# Patient Record
Sex: Female | Born: 1975
Health system: Southern US, Community
[De-identification: ages and names within clinical notes are randomized; demographics above are authoritative.]

## PROBLEM LIST (undated history)

## (undated) DIAGNOSIS — G4700 Insomnia, unspecified: Secondary | ICD-10-CM

## (undated) DIAGNOSIS — L309 Dermatitis, unspecified: Secondary | ICD-10-CM

## (undated) DIAGNOSIS — F32A Depression, unspecified: Secondary | ICD-10-CM

## (undated) DIAGNOSIS — M4802 Spinal stenosis, cervical region: Secondary | ICD-10-CM

## (undated) DIAGNOSIS — E785 Hyperlipidemia, unspecified: Secondary | ICD-10-CM

## (undated) DIAGNOSIS — J302 Other seasonal allergic rhinitis: Secondary | ICD-10-CM

## (undated) DIAGNOSIS — I10 Essential (primary) hypertension: Secondary | ICD-10-CM

## (undated) DIAGNOSIS — N3281 Overactive bladder: Secondary | ICD-10-CM

## (undated) DIAGNOSIS — R05 Cough: Secondary | ICD-10-CM

## (undated) DIAGNOSIS — M8580 Other specified disorders of bone density and structure, unspecified site: Secondary | ICD-10-CM

## (undated) DIAGNOSIS — E611 Iron deficiency: Secondary | ICD-10-CM

## (undated) DIAGNOSIS — J310 Chronic rhinitis: Secondary | ICD-10-CM

## (undated) DIAGNOSIS — R058 Other specified cough: Secondary | ICD-10-CM

## (undated) DIAGNOSIS — R42 Dizziness and giddiness: Secondary | ICD-10-CM

## (undated) DIAGNOSIS — E039 Hypothyroidism, unspecified: Secondary | ICD-10-CM

## (undated) DIAGNOSIS — F419 Anxiety disorder, unspecified: Secondary | ICD-10-CM

## (undated) DIAGNOSIS — M797 Fibromyalgia: Secondary | ICD-10-CM

## (undated) DIAGNOSIS — F329 Major depressive disorder, single episode, unspecified: Secondary | ICD-10-CM

## (undated) DIAGNOSIS — L80 Vitiligo: Secondary | ICD-10-CM

## (undated) HISTORY — DX: Essential (primary) hypertension: I10

## (undated) HISTORY — PX: SINOSCOPY: SHX187

## (undated) HISTORY — DX: Fibromyalgia: M79.7

## (undated) HISTORY — DX: Iron deficiency: E61.1

## (undated) HISTORY — DX: Dizziness and giddiness: R42

## (undated) HISTORY — DX: Dermatitis, unspecified: L30.9

## (undated) HISTORY — DX: Hypothyroidism, unspecified: E03.9

## (undated) HISTORY — DX: Hyperlipidemia, unspecified: E78.5

## (undated) HISTORY — PX: WISDOM TOOTH EXTRACTION: SHX21

## (undated) HISTORY — DX: Chronic rhinitis: J31.0

## (undated) HISTORY — DX: Overactive bladder: N32.81

## (undated) HISTORY — DX: Major depressive disorder, single episode, unspecified: F32.9

## (undated) HISTORY — DX: Depression, unspecified: F32.A

## (undated) HISTORY — DX: Spinal stenosis, cervical region: M48.02

## (undated) HISTORY — DX: Cough: R05

## (undated) HISTORY — DX: Other specified cough: R05.8

## (undated) HISTORY — DX: Vitiligo: L80

## (undated) HISTORY — PX: OTHER SURGICAL HISTORY: SHX169

## (undated) HISTORY — DX: Other specified disorders of bone density and structure, unspecified site: M85.80

---

## 1996-07-08 HISTORY — PX: DILATION AND CURETTAGE OF UTERUS: SHX78

## 2011-06-08 HISTORY — PX: NASAL SEPTUM SURGERY: SHX37

## 2012-04-07 HISTORY — PX: POLYPECTOMY: SHX149

## 2012-12-08 HISTORY — PX: HYSTEROSCOPY: SHX211

## 2015-05-16 ENCOUNTER — Encounter: Payer: Self-pay | Admitting: *Deleted

## 2015-05-17 ENCOUNTER — Ambulatory Visit (INDEPENDENT_AMBULATORY_CARE_PROVIDER_SITE_OTHER): Payer: Federal, State, Local not specified - PPO | Admitting: Internal Medicine

## 2015-05-17 ENCOUNTER — Encounter (INDEPENDENT_AMBULATORY_CARE_PROVIDER_SITE_OTHER): Payer: Self-pay

## 2015-05-17 ENCOUNTER — Encounter: Payer: Self-pay | Admitting: Internal Medicine

## 2015-05-17 VITALS — BP 110/80 | HR 94 | Ht 70.0 in | Wt 234.0 lb

## 2015-05-17 DIAGNOSIS — I10 Essential (primary) hypertension: Secondary | ICD-10-CM

## 2015-05-17 DIAGNOSIS — R05 Cough: Secondary | ICD-10-CM | POA: Diagnosis not present

## 2015-05-17 DIAGNOSIS — R058 Other specified cough: Secondary | ICD-10-CM

## 2015-05-17 MED ORDER — VALSARTAN-HYDROCHLOROTHIAZIDE 160-12.5 MG PO TABS: 1.0000 | ORAL_TABLET | Freq: Every day | ORAL | Status: DC

## 2015-05-17 NOTE — Progress Notes (Signed)
   Subjective:    Patient ID: Melissa Chandler, female    DOB: 1976/07/12,    MRN: [id number]  HPI   73 yowf never smoker no trouble as child new onset as late teenager onset HA/ sneezing /itching [place] by [place] allergy to dogs but did not complete the test rx clariton ? Help persistent daily problems with flares of "sinus infection" eval by ENT in [place] rec sinus surgery x one 2012 but continued to have draingage issues and moved to [place] Jan 2015 and Gus Height referred to pulmonary.     05/17/2015 1st  Pulmonary office visit/ Melissa Chandler  /  Lisinopril daily x 10 y  Chief Complaint  Patient presents with  . Pulmonary Consult    Referred by Melissa Plan, PA. Pt c/o "allergies"and PND for the past 20 yrs. She had sinus surgery in 2012 and symptoms seem worse since then.   cough better p kenalog shot 2 weeks prior to OV  Then best when lying down and early in am worst p stir around in am sensation of excess pnds / throat clearing and hoarseness but no excess mucus production/ really present daily x years and assoc with sensation of dysphagia to point of choking   No obvious  patterns in day to day or daytime variabilty or assoc chronic cough or cp or chest tightness, subjective wheeze overt sinus or hb symptoms. No unusual exp hx or h/o childhood pna/ asthma or knowledge of premature birth.  Sleeping ok without nocturnal  or early am exacerbation  of respiratory  c/o's or need for noct saba. Also denies any obvious fluctuation of symptoms with weather or environmental changes or other aggravating or alleviating factors except as outlined above   Current Medications, Allergies, Complete Past Medical History, Past Surgical History, Family History, and Social History were reviewed in [employer] record.           Review of Systems  Constitutional: Negative for fever, chills and unexpected weight change.  HENT: Positive for postnasal drip, sinus pressure and trouble  swallowing. Negative for congestion, dental problem, ear pain, nosebleeds, rhinorrhea, sneezing, sore throat and voice change.   Eyes: Negative for visual disturbance.  Respiratory: Negative for cough, choking and shortness of breath.   Cardiovascular: Negative for chest pain and leg swelling.  Gastrointestinal: Negative for vomiting, abdominal pain and diarrhea.  Genitourinary: Negative for difficulty urinating.  Musculoskeletal: Negative for arthralgias.  Skin: Negative for rash.  Neurological: Positive for headaches. Negative for tremors and syncope.  Hematological: Does not bruise/bleed easily.       Objective:   Physical Exam  amb wf with voice fatigue  Wt Readings from Last 3 Encounters:  05/17/15 234 lb (106.142 kg)    Vital signs reviewed  HEENT: nl dentition, turbinates, and orophanx. Nl external ear canals without cough reflex   NECK :  without JVD/Nodes/TM/ nl carotid upstrokes bilaterally   LUNGS: no acc muscle use, clear to A and P bilaterally without cough on insp or exp maneuvers   CV:  RRR  no s3 or murmur or increase in P2, no edema   ABD:  soft and nontender with nl excursion in the supine position. No bruits or organomegaly, bowel sounds nl  MS:  warm without deformities, calf tenderness, cyanosis or clubbing  SKIN: warm and dry without lesions    NEURO:  alert, approp, no deficits         Assessment & Chandler:

## 2015-05-17 NOTE — Patient Instructions (Addendum)
For drainage instead of zyrtec try to  take chlortrimeton (chlorpheniramine) 4 mg every 4 hours available over the counter (may cause drowsiness)   Stop lisinopril   Try Diovan 160 12.5  Every day instead of lisinopril   See Tammy NP in 4  weeks with all your medications, even over the counter meds, separated in two separate bags, the ones you take no matter what vs the ones you stop once you feel better and take only as needed when you feel you need them.   Tammy  will generate for you a new user friendly medication calendar that will put [place] all on the same page re: your medication use.     Without this process, it simply isn't possible to assure that we are providing  your outpatient care  with  the attention to detail we feel you deserve.   If we cannot assure that you're getting that kind of care,  then we cannot manage your problem effectively from this clinic.  Once you have seen Tammy and we are sure that we're all on the same page with your medication use she will arrange follow up with me.

## 2015-05-18 ENCOUNTER — Encounter: Payer: Self-pay | Admitting: Internal Medicine

## 2015-05-18 DIAGNOSIS — R058 Other specified cough: Secondary | ICD-10-CM | POA: Insufficient documentation

## 2015-05-18 DIAGNOSIS — R05 Cough: Secondary | ICD-10-CM | POA: Insufficient documentation

## 2015-05-18 DIAGNOSIS — I10 Essential (primary) hypertension: Secondary | ICD-10-CM | POA: Insufficient documentation

## 2015-05-18 NOTE — Assessment & Plan Note (Signed)
ACE inhibitors are problematic in  pts with airway complaints because  even experienced pulmonologists can't always distinguish ace effects from copd/asthma/pnds/ allergies etc.  By themselves they don't actually cause a problem, much like oxygen can't by itself start a fire, but they certainly serve as a powerful catalyst or enhancer for any "fire"  or inflammatory process in the upper airway, be it caused by an ET  tube or more commonly reflux (especially in the obese or pts with known GERD or who are on biphoshonates) or URI's, due to interference with bradykinin clearance.  The effects of acei on bradykinin levels occurs in 100% of pt's on acei (unless they surreptitiously stop the med!) but the classic cough is only reported in 5%.  This leaves 95% of pts on acei's  with a variety of syndromes including no identifiable symptom in most  vs non-specific symptoms that wax and wane depending on what other insult is occuring at the level of the upper airway (which may be seasonal  or perennial rhinitis in her case or GERD in that order)  rec trial of diovan 160/12.5 mg daily for now

## 2015-05-18 NOTE — Assessment & Plan Note (Addendum)
The most common causes of chronic cough in immunocompetent adults include the following: upper airway cough syndrome (UACS), previously referred to as postnasal drip syndrome (PNDS), which is caused by variety of rhinosinus conditions; (2) asthma; (3) GERD; (4) chronic bronchitis from cigarette smoking or other inhaled environmental irritants; (5) nonasthmatic eosinophilic bronchitis; and (6) bronchiectasis.   These conditions, singly or in combination, have accounted for up to 94% of the causes of chronic cough in prospective studies.   Other conditions have constituted no >6% of the causes in prospective studies These have included bronchogenic carcinoma, chronic interstitial pneumonia, sarcoidosis, left ventricular failure, ACEI-induced cough, and aspiration from a condition associated with pharyngeal dysfunction.    Chronic cough is often simultaneously caused by more than one condition. A single cause has been found from 38 to 82% of the time, multiple causes from 18 to 62%. Multiply caused cough has been the result of three diseases up to 42% of the time.       Based on hx and exam, this is most likely:  Classic Upper airway cough syndrome, so named because it's frequently impossible to sort out how much is  CR/sinusitis with freq throat clearing (which can be related to primary GERD)   vs  causing  secondary (" extra esophageal")  GERD from wide swings in gastric pressure that occur with throat clearing, often  promoting self use of mint and menthol lozenges that reduce the lower esophageal sphincter tone and exacerbate the problem further in a cyclical fashion.   These are the same pts (now being labeled as having "irritable larynx syndrome" by some cough centers) who not infrequently have a history of having failed to tolerate ace inhibitors,  dry powder inhalers or biphosphonates or report having atypical reflux symptoms that don't respond to standard doses of PPI , and are easily confused as  having aecopd or asthma flares by even experienced allergists/ pulmonologists.   The first step is to  eliminate acei  then regroup in 2 weeks/ in meantime try 1st gen H1 per guidelines for sense of pnds (may well have underlying rhinitis as trigger and acei as "fuel" for upper airways inflammation)   See instructions for specific recommendations which were reviewed directly with the patient who was given a copy with highlighter outlining the key components.

## 2015-06-19 ENCOUNTER — Encounter: Payer: Federal, State, Local not specified - PPO | Admitting: Adult Health

## 2016-04-17 ENCOUNTER — Other Ambulatory Visit: Payer: Self-pay | Admitting: Internal Medicine

## 2016-05-20 ENCOUNTER — Other Ambulatory Visit: Payer: Self-pay | Admitting: Internal Medicine

## 2017-03-10 DIAGNOSIS — E7800 Pure hypercholesterolemia, unspecified: Secondary | ICD-10-CM | POA: Diagnosis not present

## 2017-03-10 DIAGNOSIS — I10 Essential (primary) hypertension: Secondary | ICD-10-CM | POA: Diagnosis not present

## 2017-03-31 DIAGNOSIS — Z79899 Other long term (current) drug therapy: Secondary | ICD-10-CM | POA: Diagnosis not present

## 2017-03-31 DIAGNOSIS — M546 Pain in thoracic spine: Secondary | ICD-10-CM | POA: Diagnosis not present

## 2017-03-31 DIAGNOSIS — G894 Chronic pain syndrome: Secondary | ICD-10-CM | POA: Diagnosis not present

## 2017-03-31 DIAGNOSIS — M25559 Pain in unspecified hip: Secondary | ICD-10-CM | POA: Diagnosis not present

## 2017-03-31 DIAGNOSIS — Z79891 Long term (current) use of opiate analgesic: Secondary | ICD-10-CM | POA: Diagnosis not present

## 2017-04-08 DIAGNOSIS — E7800 Pure hypercholesterolemia, unspecified: Secondary | ICD-10-CM | POA: Diagnosis not present

## 2017-04-08 DIAGNOSIS — E038 Other specified hypothyroidism: Secondary | ICD-10-CM | POA: Diagnosis not present

## 2017-04-08 DIAGNOSIS — D509 Iron deficiency anemia, unspecified: Secondary | ICD-10-CM | POA: Diagnosis not present

## 2017-04-10 DIAGNOSIS — I10 Essential (primary) hypertension: Secondary | ICD-10-CM | POA: Diagnosis not present

## 2017-04-17 ENCOUNTER — Other Ambulatory Visit: Payer: Self-pay | Admitting: Physician Assistant

## 2017-04-17 ENCOUNTER — Ambulatory Visit
Admission: RE | Admit: 2017-04-17 | Discharge: 2017-04-17 | Disposition: A | Discharge status: Home / Self Care | Payer: 59 | Source: Ambulatory Visit | Attending: Physician Assistant | Admitting: Physician Assistant

## 2017-04-17 DIAGNOSIS — M25561 Pain in right knee: Secondary | ICD-10-CM

## 2017-04-17 DIAGNOSIS — M546 Pain in thoracic spine: Secondary | ICD-10-CM | POA: Diagnosis not present

## 2017-04-17 DIAGNOSIS — M25562 Pain in left knee: Secondary | ICD-10-CM

## 2017-04-17 DIAGNOSIS — M542 Cervicalgia: Secondary | ICD-10-CM

## 2017-04-17 DIAGNOSIS — M4302 Spondylolysis, cervical region: Secondary | ICD-10-CM | POA: Diagnosis not present

## 2017-04-17 DIAGNOSIS — M1711 Unilateral primary osteoarthritis, right knee: Secondary | ICD-10-CM | POA: Diagnosis not present

## 2017-05-12 DIAGNOSIS — M546 Pain in thoracic spine: Secondary | ICD-10-CM | POA: Diagnosis not present

## 2017-05-12 DIAGNOSIS — Z79891 Long term (current) use of opiate analgesic: Secondary | ICD-10-CM | POA: Diagnosis not present

## 2017-05-12 DIAGNOSIS — G894 Chronic pain syndrome: Secondary | ICD-10-CM | POA: Diagnosis not present

## 2017-05-12 DIAGNOSIS — Z79899 Other long term (current) drug therapy: Secondary | ICD-10-CM | POA: Diagnosis not present

## 2017-05-12 DIAGNOSIS — M25559 Pain in unspecified hip: Secondary | ICD-10-CM | POA: Diagnosis not present

## 2017-07-01 DIAGNOSIS — Z789 Other specified health status: Secondary | ICD-10-CM | POA: Diagnosis not present

## 2017-07-01 DIAGNOSIS — Z23 Encounter for immunization: Secondary | ICD-10-CM | POA: Diagnosis not present

## 2017-07-08 DIAGNOSIS — I10 Essential (primary) hypertension: Secondary | ICD-10-CM | POA: Diagnosis not present

## 2017-07-24 DIAGNOSIS — M25559 Pain in unspecified hip: Secondary | ICD-10-CM | POA: Diagnosis not present

## 2017-07-24 DIAGNOSIS — M546 Pain in thoracic spine: Secondary | ICD-10-CM | POA: Diagnosis not present

## 2017-07-24 DIAGNOSIS — G894 Chronic pain syndrome: Secondary | ICD-10-CM | POA: Diagnosis not present

## 2017-07-24 DIAGNOSIS — Z79899 Other long term (current) drug therapy: Secondary | ICD-10-CM | POA: Diagnosis not present

## 2017-07-24 DIAGNOSIS — Z79891 Long term (current) use of opiate analgesic: Secondary | ICD-10-CM | POA: Diagnosis not present

## 2017-09-15 DIAGNOSIS — M25559 Pain in unspecified hip: Secondary | ICD-10-CM | POA: Diagnosis not present

## 2017-09-15 DIAGNOSIS — Z79891 Long term (current) use of opiate analgesic: Secondary | ICD-10-CM | POA: Diagnosis not present

## 2017-09-15 DIAGNOSIS — G894 Chronic pain syndrome: Secondary | ICD-10-CM | POA: Diagnosis not present

## 2017-09-15 DIAGNOSIS — M546 Pain in thoracic spine: Secondary | ICD-10-CM | POA: Diagnosis not present

## 2017-09-15 DIAGNOSIS — Z79899 Other long term (current) drug therapy: Secondary | ICD-10-CM | POA: Diagnosis not present

## 2017-10-27 DIAGNOSIS — E038 Other specified hypothyroidism: Secondary | ICD-10-CM | POA: Diagnosis not present

## 2017-10-27 DIAGNOSIS — E7800 Pure hypercholesterolemia, unspecified: Secondary | ICD-10-CM | POA: Diagnosis not present

## 2017-10-28 DIAGNOSIS — E038 Other specified hypothyroidism: Secondary | ICD-10-CM | POA: Diagnosis not present

## 2017-10-28 DIAGNOSIS — I10 Essential (primary) hypertension: Secondary | ICD-10-CM | POA: Diagnosis not present

## 2017-11-24 DIAGNOSIS — M546 Pain in thoracic spine: Secondary | ICD-10-CM | POA: Diagnosis not present

## 2017-11-24 DIAGNOSIS — Z79891 Long term (current) use of opiate analgesic: Secondary | ICD-10-CM | POA: Diagnosis not present

## 2017-11-24 DIAGNOSIS — M1710 Unilateral primary osteoarthritis, unspecified knee: Secondary | ICD-10-CM | POA: Diagnosis not present

## 2017-11-24 DIAGNOSIS — Z79899 Other long term (current) drug therapy: Secondary | ICD-10-CM | POA: Diagnosis not present

## 2017-11-24 DIAGNOSIS — G894 Chronic pain syndrome: Secondary | ICD-10-CM | POA: Diagnosis not present

## 2018-01-18 DIAGNOSIS — M79606 Pain in leg, unspecified: Secondary | ICD-10-CM | POA: Diagnosis not present

## 2018-02-18 DIAGNOSIS — G894 Chronic pain syndrome: Secondary | ICD-10-CM | POA: Diagnosis not present

## 2018-02-18 DIAGNOSIS — Z79891 Long term (current) use of opiate analgesic: Secondary | ICD-10-CM | POA: Diagnosis not present

## 2018-02-18 DIAGNOSIS — M1710 Unilateral primary osteoarthritis, unspecified knee: Secondary | ICD-10-CM | POA: Diagnosis not present

## 2018-02-18 DIAGNOSIS — Z79899 Other long term (current) drug therapy: Secondary | ICD-10-CM | POA: Diagnosis not present

## 2018-02-18 DIAGNOSIS — M25559 Pain in unspecified hip: Secondary | ICD-10-CM | POA: Diagnosis not present

## 2018-02-18 DIAGNOSIS — M546 Pain in thoracic spine: Secondary | ICD-10-CM | POA: Diagnosis not present

## 2018-04-02 DIAGNOSIS — I10 Essential (primary) hypertension: Secondary | ICD-10-CM | POA: Diagnosis not present

## 2018-04-02 DIAGNOSIS — E7800 Pure hypercholesterolemia, unspecified: Secondary | ICD-10-CM | POA: Diagnosis not present

## 2018-04-05 DIAGNOSIS — M797 Fibromyalgia: Secondary | ICD-10-CM | POA: Diagnosis not present

## 2018-04-05 DIAGNOSIS — E7800 Pure hypercholesterolemia, unspecified: Secondary | ICD-10-CM | POA: Diagnosis not present

## 2018-04-05 DIAGNOSIS — Z0000 Encounter for general adult medical examination without abnormal findings: Secondary | ICD-10-CM | POA: Diagnosis not present

## 2018-04-08 DIAGNOSIS — M1710 Unilateral primary osteoarthritis, unspecified knee: Secondary | ICD-10-CM | POA: Diagnosis not present

## 2018-04-08 DIAGNOSIS — Z79899 Other long term (current) drug therapy: Secondary | ICD-10-CM | POA: Diagnosis not present

## 2018-04-08 DIAGNOSIS — Z79891 Long term (current) use of opiate analgesic: Secondary | ICD-10-CM | POA: Diagnosis not present

## 2018-04-08 DIAGNOSIS — G894 Chronic pain syndrome: Secondary | ICD-10-CM | POA: Diagnosis not present

## 2018-05-20 DIAGNOSIS — M25559 Pain in unspecified hip: Secondary | ICD-10-CM | POA: Diagnosis not present

## 2018-05-20 DIAGNOSIS — G894 Chronic pain syndrome: Secondary | ICD-10-CM | POA: Diagnosis not present

## 2018-05-20 DIAGNOSIS — M1710 Unilateral primary osteoarthritis, unspecified knee: Secondary | ICD-10-CM | POA: Diagnosis not present

## 2018-05-31 DIAGNOSIS — Z01419 Encounter for gynecological examination (general) (routine) without abnormal findings: Secondary | ICD-10-CM | POA: Diagnosis not present

## 2018-06-06 IMAGING — CR DG KNEE COMPLETE 4+V*L*
3 series · 3 of 3 positions shown · non-contrast
Comparison: None.
COMPARISON: None.
COMPARISON: None.

ADDENDUM:
CLINICAL DATA: History of fibromyalgia.  Thoracic pain. No
trauma.

EXAM:
RIGHT KNEE - COMPLETE 4+ VIEW
CLINICAL DATA: History of fibromyalgia. Thoracic pain. No
LEFT KNEE - COMPLETE 4+ VIEW
CLINICAL DATA: History of fibromyalgia per thoracic pain. No
THORACIC SPINE - 3 VIEWS; RIGHT KNEE - COMPLETE 4+ VIEW; LEFT KNEE -
COMPLETE 4+ VIEW

[w knee ap left (1 of 2)]
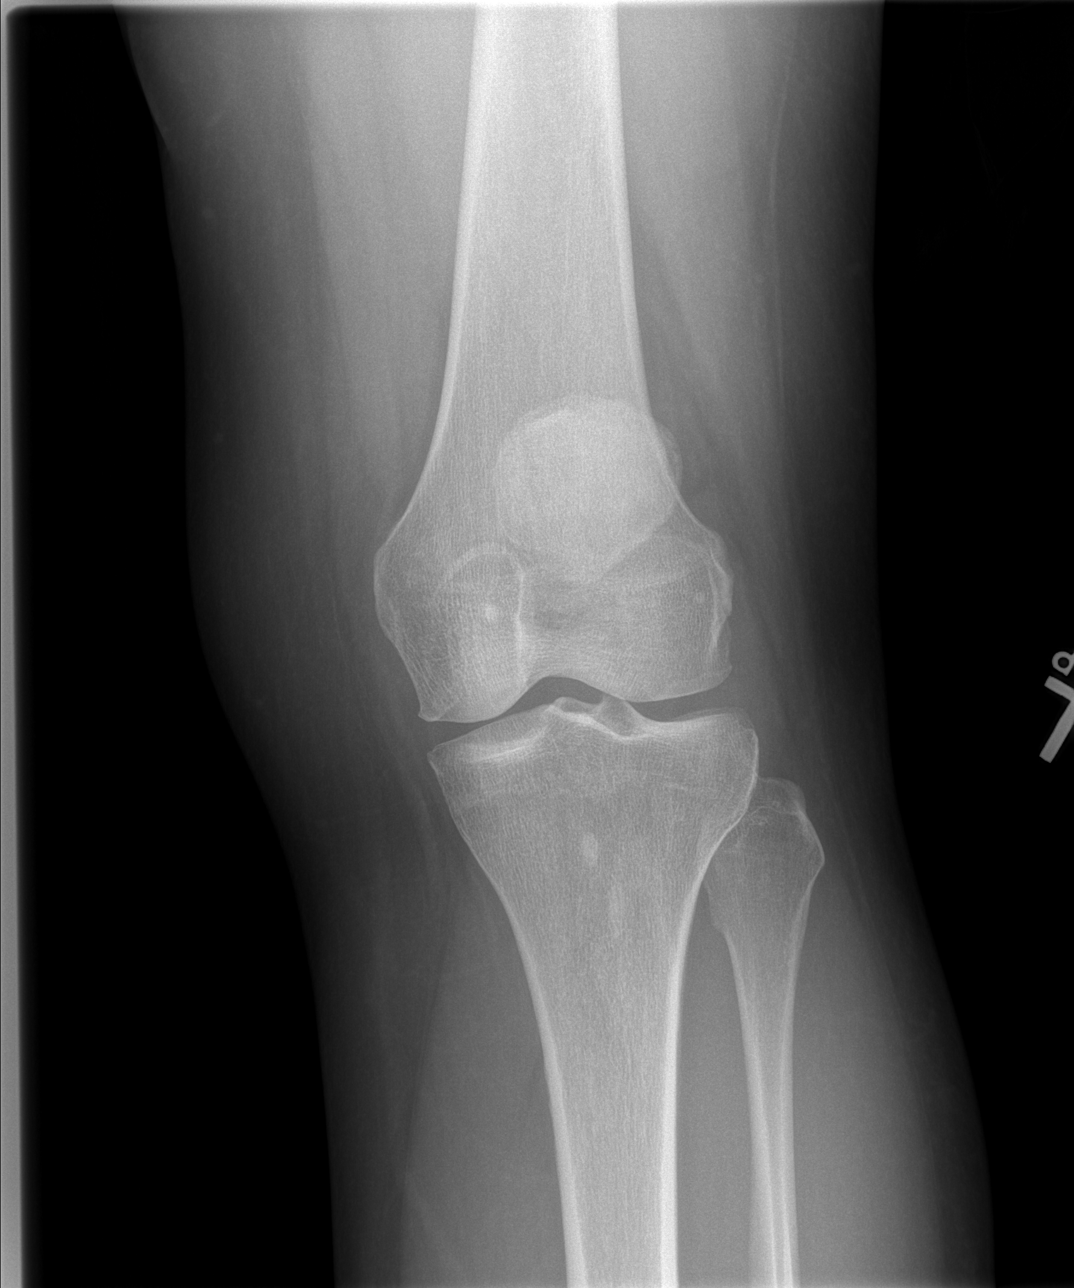

[w knee ap left (2 of 2)]
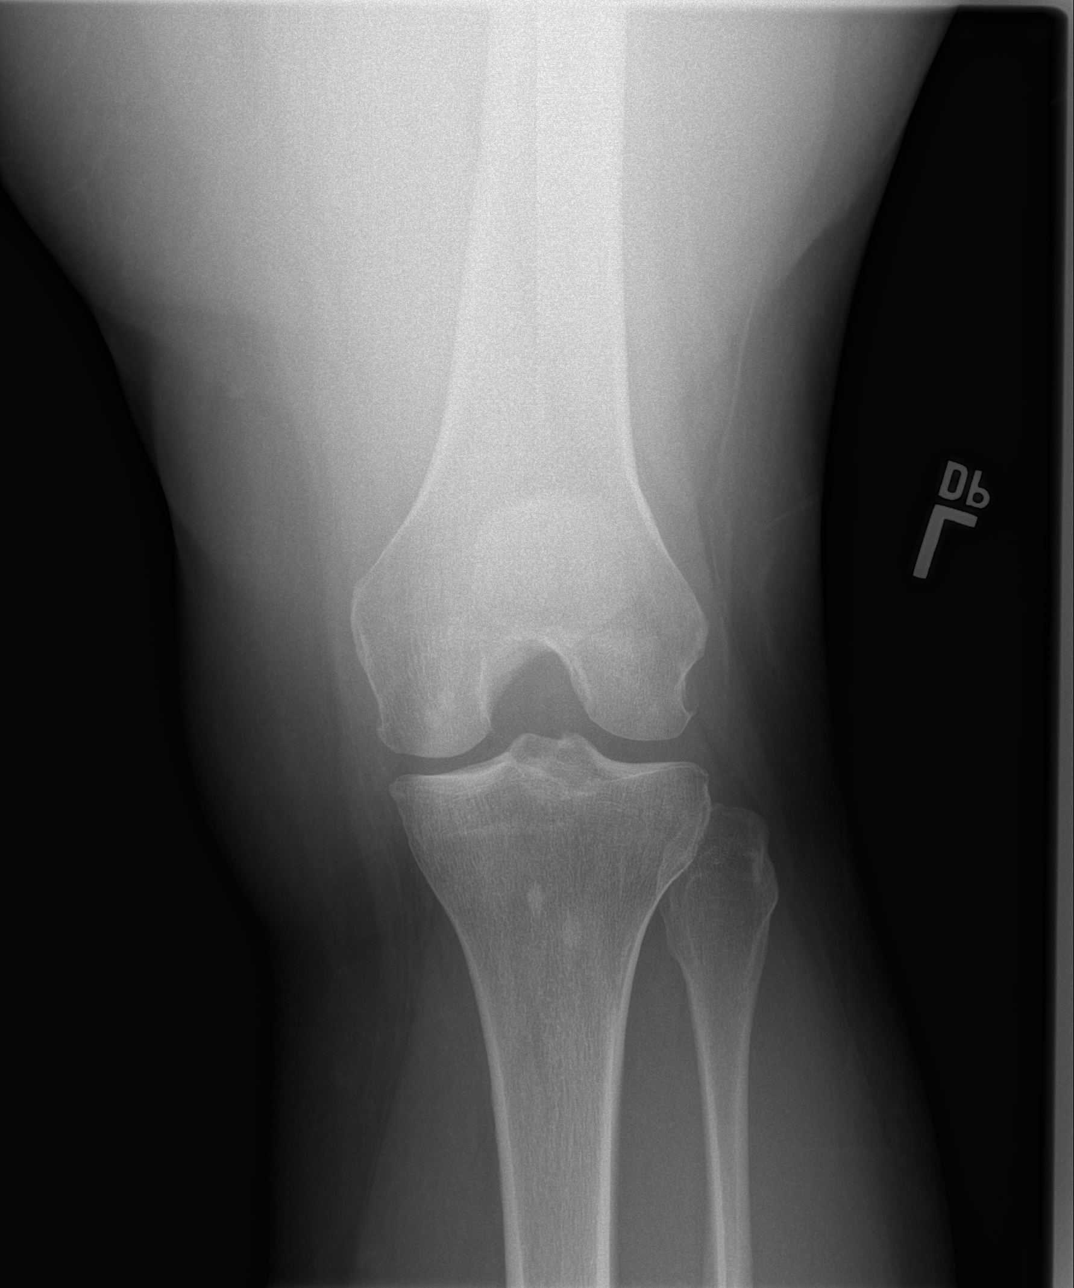

[w knee lat. left]
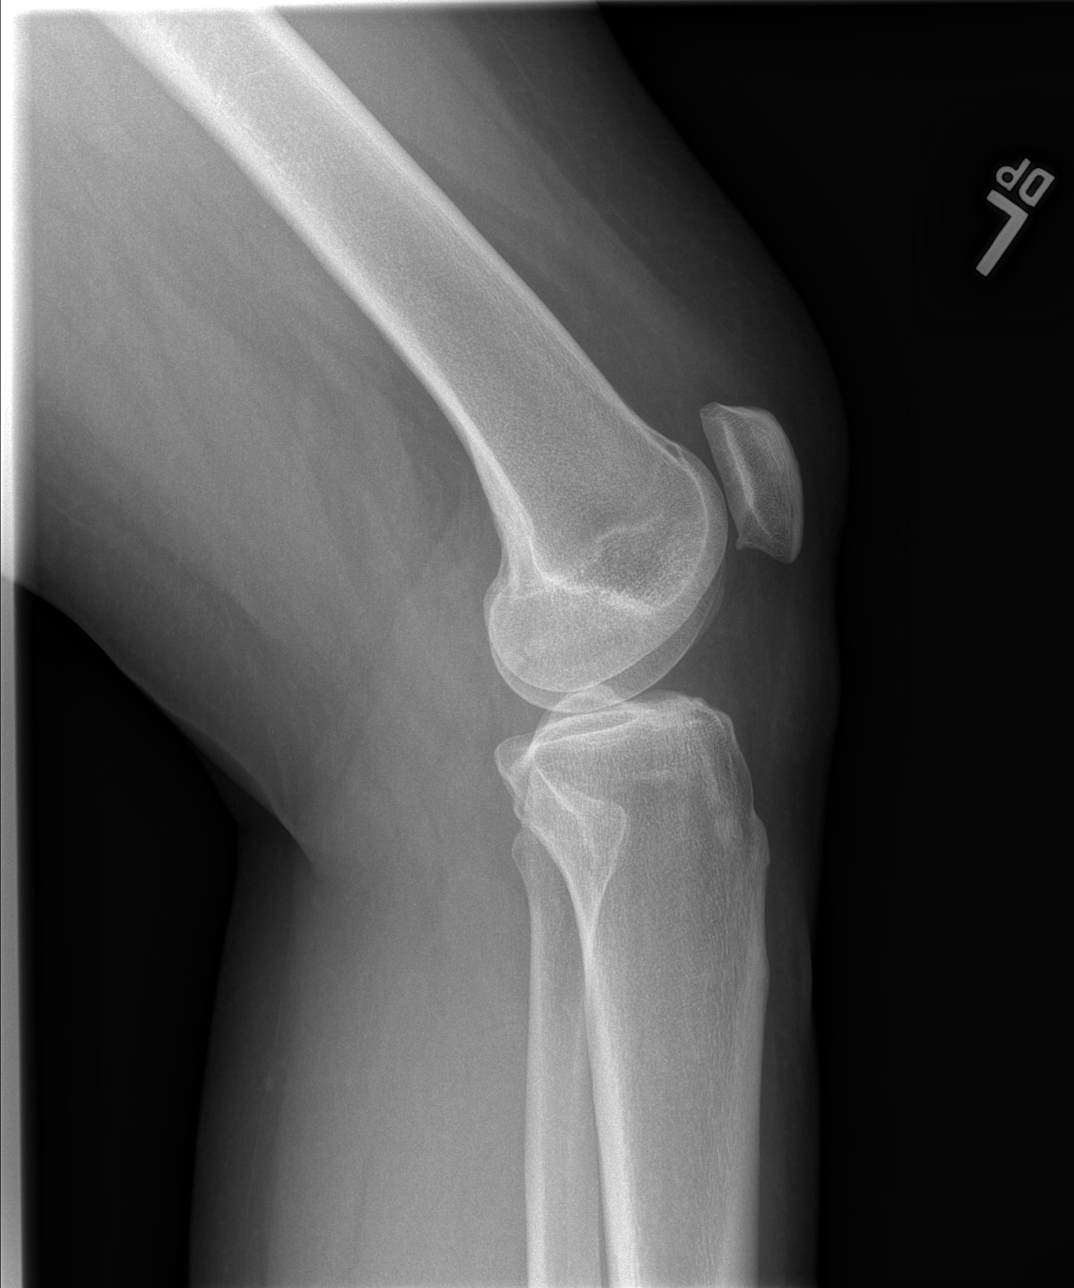

[3 of 3 positions shown; findings below may reference images not displayed]

FINDINGS: There is subtle early degenerative changes. No fracture or
dislocation. No significant joint effusion.
IMPRESSION: No acute findings.

Subtle early degenerative changes.

ADDENDUM:
FINDINGS: There is subtle early degenerative changes. No fracture or
dislocation. No significant joint effusion. There are a few small
bone islands over the distal femur and proximal tibia.
IMPRESSION: No acute findings.

Subtle early degenerative changes.
FINDINGS: Subtle curvature of the upper thoracic spine likely positional.
Vertebral body heights are maintained. There is minimal spondylosis
throughout the thoracic spine. Disc spaces are within normal. No
evidence of compression fracture or subluxation. Pedicles are
intact. Degenerative change of the cervical spine with multilevel
disc disease within the cervical spine.
IMPRESSION: Mild spondylosis of the thoracic spine.  No acute findings.

## 2018-06-06 IMAGING — CR DG CERVICAL SPINE 2 OR 3 VIEWS
3 series · 3 of 3 positions shown · non-contrast
Comparison: None.

CLINICAL DATA: Fibromyalgia and pain.  No trauma

EXAM:
CERVICAL SPINE - 2-3 VIEW

[w c-spine lat]
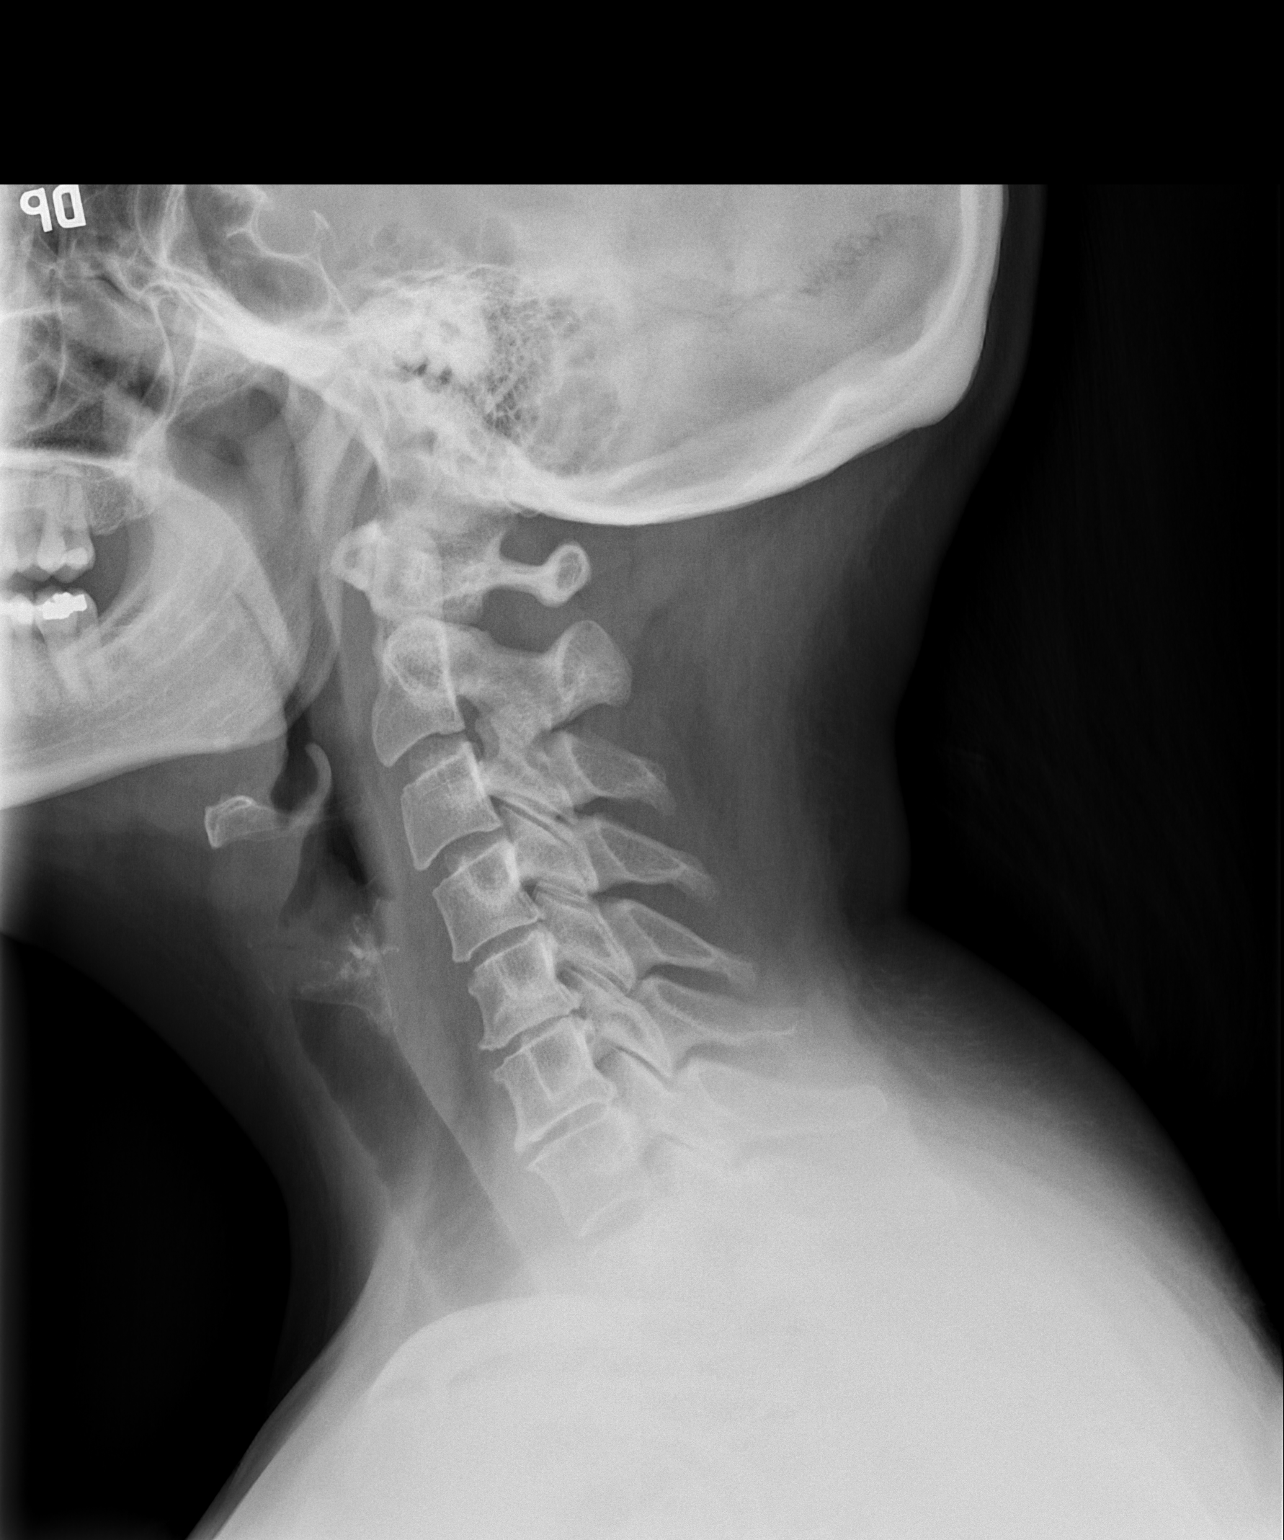

[w c-spine a.p. *]
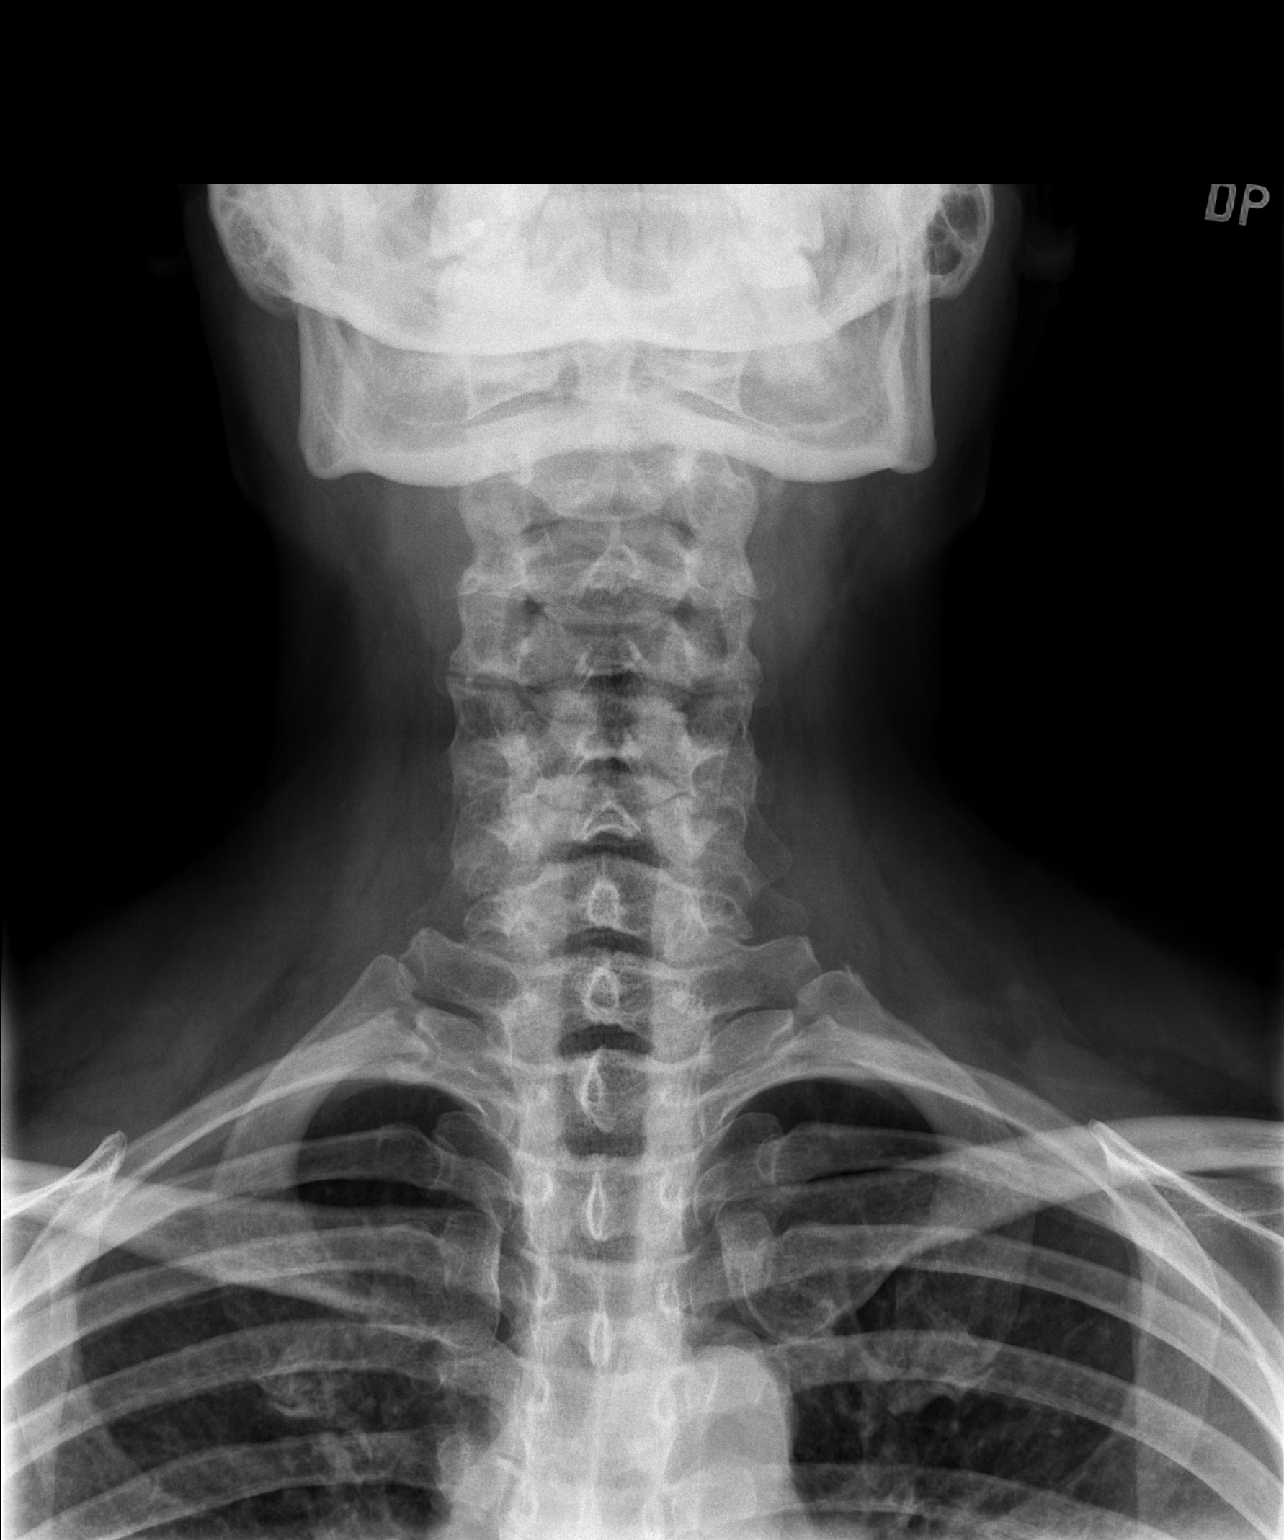

[w c-spine odontoid]
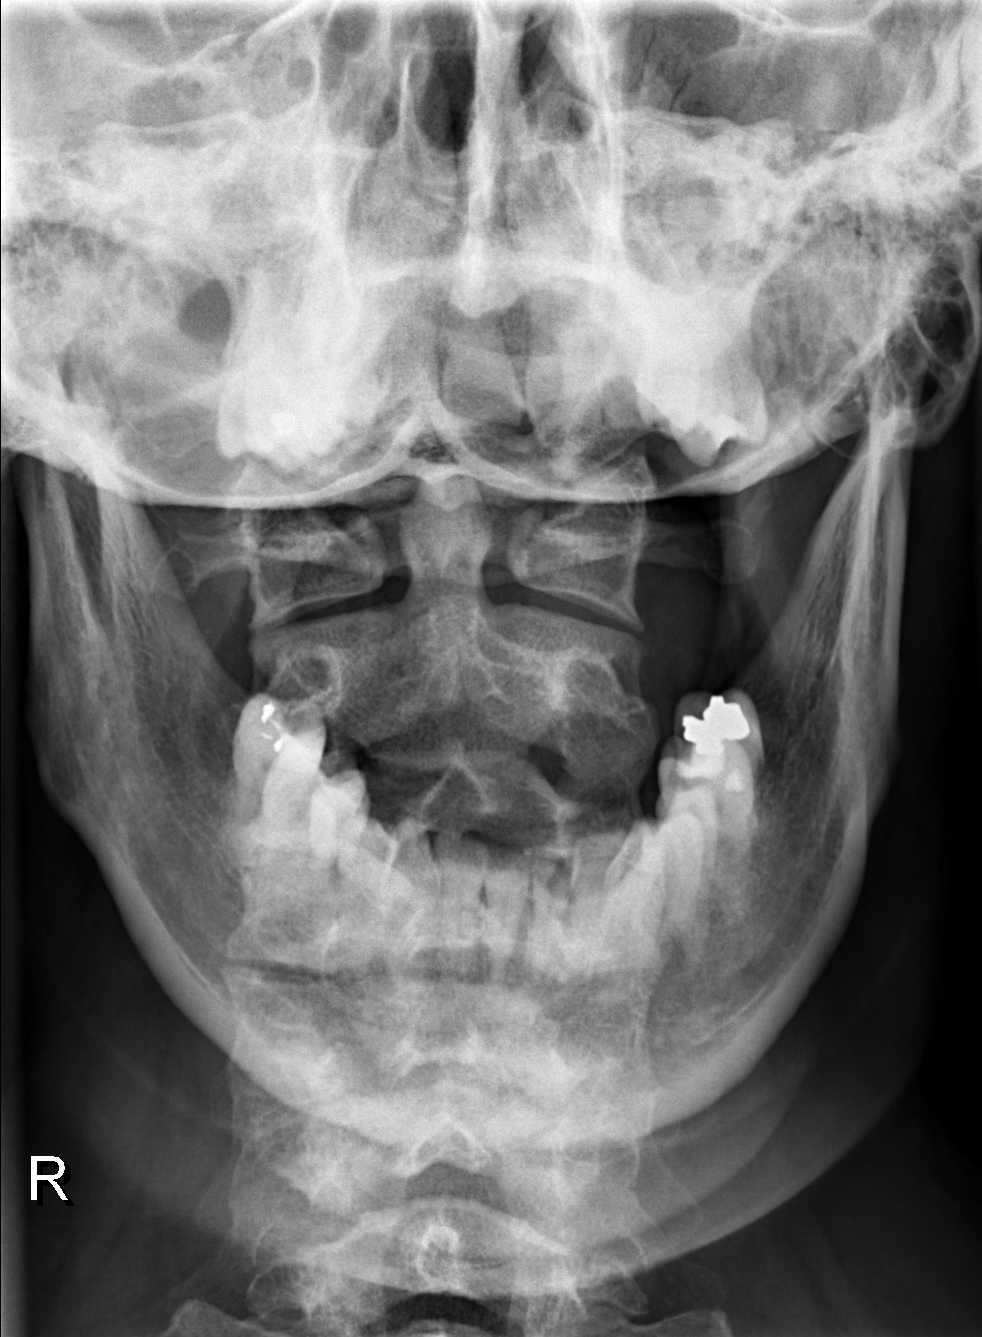

[3 of 3 positions shown; findings below may reference images not displayed]

FINDINGS: There is no evidence of cervical spine fracture or prevertebral soft
tissue swelling. Alignment is normal. There is mild disc space
narrowing at C5-6 and C6-7 with uncovertebral spurring and anterior
osteophyte formation. The lung apices are normal. The dens is
intact. Lateral masses of C1 and C2 are aligned. Prevertebral soft
tissues are normal.
IMPRESSION: Mild lower cervical degenerative disc disease. No fracture or
listhesis.

## 2018-06-22 DIAGNOSIS — Z1231 Encounter for screening mammogram for malignant neoplasm of breast: Secondary | ICD-10-CM | POA: Diagnosis not present

## 2018-07-15 DIAGNOSIS — Z79891 Long term (current) use of opiate analgesic: Secondary | ICD-10-CM | POA: Diagnosis not present

## 2018-07-15 DIAGNOSIS — M1710 Unilateral primary osteoarthritis, unspecified knee: Secondary | ICD-10-CM | POA: Diagnosis not present

## 2018-07-15 DIAGNOSIS — M25559 Pain in unspecified hip: Secondary | ICD-10-CM | POA: Diagnosis not present

## 2018-07-15 DIAGNOSIS — Z79899 Other long term (current) drug therapy: Secondary | ICD-10-CM | POA: Diagnosis not present

## 2018-07-15 DIAGNOSIS — G894 Chronic pain syndrome: Secondary | ICD-10-CM | POA: Diagnosis not present

## 2018-07-20 ENCOUNTER — Other Ambulatory Visit: Payer: Self-pay | Admitting: Obstetrics and Gynecology

## 2018-07-20 DIAGNOSIS — H9313 Tinnitus, bilateral: Secondary | ICD-10-CM | POA: Diagnosis not present

## 2018-07-20 DIAGNOSIS — E038 Other specified hypothyroidism: Secondary | ICD-10-CM | POA: Diagnosis not present

## 2018-07-20 DIAGNOSIS — H938X3 Other specified disorders of ear, bilateral: Secondary | ICD-10-CM | POA: Diagnosis not present

## 2018-07-20 DIAGNOSIS — N939 Abnormal uterine and vaginal bleeding, unspecified: Secondary | ICD-10-CM

## 2018-08-20 ENCOUNTER — Ambulatory Visit
Admission: RE | Admit: 2018-08-20 | Discharge: 2018-08-20 | Disposition: A | Discharge status: Home / Self Care | Payer: 59 | Source: Ambulatory Visit | Attending: Obstetrics and Gynecology | Admitting: Obstetrics and Gynecology

## 2018-08-20 DIAGNOSIS — N939 Abnormal uterine and vaginal bleeding, unspecified: Secondary | ICD-10-CM | POA: Diagnosis not present

## 2018-08-26 DIAGNOSIS — J3089 Other allergic rhinitis: Secondary | ICD-10-CM | POA: Diagnosis not present

## 2018-08-26 DIAGNOSIS — H9011 Conductive hearing loss, unilateral, right ear, with unrestricted hearing on the contralateral side: Secondary | ICD-10-CM | POA: Diagnosis not present

## 2018-08-26 DIAGNOSIS — H9313 Tinnitus, bilateral: Secondary | ICD-10-CM | POA: Diagnosis not present

## 2018-08-26 DIAGNOSIS — J3081 Allergic rhinitis due to animal (cat) (dog) hair and dander: Secondary | ICD-10-CM | POA: Diagnosis not present

## 2018-08-26 DIAGNOSIS — J301 Allergic rhinitis due to pollen: Secondary | ICD-10-CM | POA: Diagnosis not present

## 2018-08-31 DIAGNOSIS — Z23 Encounter for immunization: Secondary | ICD-10-CM | POA: Diagnosis not present

## 2018-09-09 DIAGNOSIS — M47812 Spondylosis without myelopathy or radiculopathy, cervical region: Secondary | ICD-10-CM | POA: Diagnosis not present

## 2018-09-09 DIAGNOSIS — G894 Chronic pain syndrome: Secondary | ICD-10-CM | POA: Diagnosis not present

## 2018-09-09 DIAGNOSIS — M1710 Unilateral primary osteoarthritis, unspecified knee: Secondary | ICD-10-CM | POA: Diagnosis not present

## 2018-09-09 DIAGNOSIS — Z79899 Other long term (current) drug therapy: Secondary | ICD-10-CM | POA: Diagnosis not present

## 2018-09-09 DIAGNOSIS — Z79891 Long term (current) use of opiate analgesic: Secondary | ICD-10-CM | POA: Diagnosis not present

## 2018-09-20 DIAGNOSIS — N920 Excessive and frequent menstruation with regular cycle: Secondary | ICD-10-CM | POA: Diagnosis not present

## 2018-09-23 ENCOUNTER — Ambulatory Visit: Payer: Self-pay | Admitting: Allergy

## 2018-09-27 ENCOUNTER — Ambulatory Visit (INDEPENDENT_AMBULATORY_CARE_PROVIDER_SITE_OTHER): Payer: 59 | Admitting: Allergy

## 2018-09-27 ENCOUNTER — Encounter: Payer: Self-pay | Admitting: Allergy

## 2018-09-27 VITALS — BP 124/72 | HR 93 | Temp 99.1°F | Resp 18 | Ht 67.1 in | Wt 225.6 lb

## 2018-09-27 DIAGNOSIS — R519 Headache, unspecified: Secondary | ICD-10-CM

## 2018-09-27 DIAGNOSIS — R51 Headache: Secondary | ICD-10-CM

## 2018-09-27 DIAGNOSIS — J3089 Other allergic rhinitis: Secondary | ICD-10-CM

## 2018-09-27 MED ORDER — FLUTICASONE PROPIONATE 93 MCG/ACT NA EXHU: 1.0000 | INHALANT_SUSPENSION | Freq: Two times a day (BID) | NASAL | 5 refills | Status: AC

## 2018-09-27 NOTE — Assessment & Plan Note (Addendum)
Perennial rhinitis symptoms for the past 20+ years. Dairy worsens symptoms.  Tried Zyrtec, Allegra, Singulair and Nasonex with minimal benefit.  Allegra D and Afrin seems to work the best. Skin testing in her 52s was positive to dogs and yeast per patient report.  Followed by ENT.  Today's skin testing was positive to: negative to environmental allergy panel including positive control questioning the validity of the results.   Get bloodwork as below and will make additional recommendations based on results.   Start Xhance 1 spray twice a day and demonstrated proper use.   May use over the counter antihistamines such as Zyrtec (cetirizine), Claritin (loratadine), Allegra (fexofenadine), or Xyzal (levocetirizine) daily as needed.  Only use decongestants sparingly when needed and not on a daily basis.   Avoid drinking dairy at night.   Lab Orders     Allergens w/Total IgE Area 2

## 2018-09-27 NOTE — Patient Instructions (Addendum)
Other allergic rhinitis Perennial rhinitis symptoms for the past 20+ years. Dairy worsens symptoms.  Tried Zyrtec, Allegra, Singulair and Nasonex with minimal benefit.  Allegra D and Afrin seems to work the best. Skin testing in her 28s was positive to dogs and yeast per patient report.  Followed by ENT.  Today's skin testing was positive to: negative to environmental allergy panel including positive control questioning the validity of the results.   Get bloodwork as below and will make additional recommendations based on results.   Start Xhance 1 spray twice a day and demonstrated proper use.   May use over the counter antihistamines such as Zyrtec (cetirizine), Claritin (loratadine), Allegra (fexofenadine), or Xyzal (levocetirizine) daily as needed.  Only use decongestants sparingly when needed and not on a daily basis.   Avoid drinking dairy at night.   Lab Orders     Allergens w/Total IgE Area 2  Headache disorder Headaches on and off. Triggers include sound, light and certain scents.  Discussed with patient that she may have migraines given clinical history.  Follow up with PCP regarding this.   Return in about 3 months (around 12/28/2018).

## 2018-09-27 NOTE — Progress Notes (Signed)
New Patient Note  RE: Melissa Chandler MRN: [id number] DOB: 08-Jul-1976 Date of Office Visit: 09/27/2018  Referring provider: Keturah Barre, MD Primary care provider: Jarrett Soho, PA-C  Chief Complaint: Allergy Testing (Enviornmentals)  History of Present Illness: I had the pleasure of seeing Melissa Chandler for initial evaluation at the [facility] on 09/27/2018. She is a 42 y.o. female, who is referred here by ENT Dr. Haroldine Laws for the evaluation of environmental allergies.   She reports symptoms of itchy ears/throat, sneezing, dizziness, nasal congestion, rhinorrhea, sinus pressure. Symptoms have been going on for 20+ years. The symptoms are present all year. Other triggers include exposure to ? dairy. Anosmia: diminished sense of smell. Headache: yes. She has used zyrtec, allegra, Singulair, Nasonex with minimal improvement in symptoms. Afrin seems to help. Sinus infections: none. Previous work up includes: skin testing in her 38s which showed positive to dog and yeast per patient report.  Previous ENT evaluation: yes. Previous sinus imaging: no.  Assessment and Plan: Melissa Chandler is a 42 y.o. female with: Other allergic rhinitis Perennial rhinitis symptoms for the past 20+ years. Dairy worsens symptoms.  Tried Zyrtec, Allegra, Singulair and Nasonex with minimal benefit.  Allegra D and Afrin seems to work the best. Skin testing in her 75s was positive to dogs and yeast per patient report.  Followed by ENT.  Today's skin testing was positive to: negative to environmental allergy panel including positive control questioning the validity of the results.   Get bloodwork as below and will make additional recommendations based on results.   Start Xhance 1 spray twice a day and demonstrated proper use.   May use over the counter antihistamines such as Zyrtec (cetirizine), Claritin (loratadine), Allegra (fexofenadine), or Xyzal (levocetirizine) daily as needed.  Only use  decongestants sparingly when needed and not on a daily basis.   Avoid drinking dairy at night.   Lab Orders     Allergens w/Total IgE Area 2  Headache disorder Headaches on and off. Triggers include sound, light and certain scents.  Discussed with patient that she may have migraines given clinical history.  Follow up with PCP regarding this.   Return in about 3 months (around 12/28/2018).  Meds ordered this encounter  Medications  . Fluticasone Propionate (XHANCE) 93 MCG/ACT EXHU    Sig: Place 1 spray into the nose 2 (two) times daily.    Dispense:  16 mL    Refill:  5   Other allergy screening: Asthma: no Rhino conjunctivitis: yes Food allergy: no Medication allergy: no Hymenoptera allergy: no Urticaria: no Eczema:no History of recurrent infections suggestive of immunodeficency: no  Diagnostics: Skin Testing: Environmental allergy panel. Negative test to: Environmental allergy panel including positive control questioning the validity of results.  Results discussed with patient/family. Airborne Adult Perc - 09/27/18 1459    Time Antigen Placed  1459    Allergen Manufacturer  Waynette Buttery    Location  Back    Number of Test  59    Panel 1  Select    1. Control-Buffer 50% Glycerol  Negative    2. Control-Histamine 1 mg/ml  Negative    3. Albumin saline  Negative    4. Bahia  Negative    5. French Southern Territ[place]  Negative    6. Johnson  Negative    7. Kentucky Blue  Negative    8. Meadow Fescue  Negative    9. Perennial Rye  Negative    10. Sweet Vernal  Negative  11. Timothy  Negative    12. Cocklebur  Negative    13. Burweed Marshelder  Negative    14. Ragweed, short  Negative    15. Ragweed, Giant  Negative    16. Plantain,  English  Negative    17. Lamb's Quarters  Negative    18. Sheep Sorrell  Negative    19. Rough Pigweed  Negative    20. Marsh Elder, Rough  Negative    21. Mugwort, Common  Negative    22. Ash mix  Negative    23. Birch mix  Negative    24. Beech  American  Negative    25. Box, Elder  Negative    26. Cedar, red  Negative    27. Cottonwood, Guinea-Bissau  Negative    28. Elm mix  Negative    29. Hickory mix  Negative    30. Maple mix  Negative    31. Oak, Guinea-Bissau mix  Negative    32. Pecan Pollen  Negative    33. Pine mix  Negative    34. Sycamore Eastern  Negative    35. Walnut, Black Pollen  Negative    36. Alternaria alternata  Negative    37. Cladosporium Herbarum  Negative    38. Aspergillus mix  Negative    39. Penicillium mix  Negative    40. Bipolaris sorokiniana (Helminthosporium)  Negative    41. Drechslera spicifera (Curvularia)  Negative    42. Mucor plumbeus  Negative    43. Fusarium moniliforme  Negative    44. Aureobasidium pullulans (pullulara)  Negative    45. Rhizopus oryzae  Negative    46. Botrytis cinera  Negative    47. Epicoccum nigrum  Negative    48. Phoma betae  Negative    49. Candida Albicans  Negative    50. Trichophyton mentagrophytes  Negative    51. Mite, D Farinae  5,000 AU/ml  Negative    52. Mite, D Pteronyssinus  5,000 AU/ml  Negative    53. Cat Hair 10,000 BAU/ml  Negative    54.  Dog Epithelia  Negative    55. Mixed Feathers  Negative    56. Horse Epithelia  Negative    57. Cockroach, German  Negative    58. Mouse  Negative    59. Tobacco Leaf  Negative       Past Medical History: Patient Active Problem List   Diagnosis Date Noted  . Other allergic rhinitis 09/27/2018  . Headache disorder 09/27/2018  . Upper airway cough syndrome 05/18/2015  . Essential hypertension 05/18/2015   Past Medical History:  Diagnosis Date  . Depression   . Eczema   . Fibromyalgia   . HTN (hypertension)   . Hyperlipidemia   . Hypothyroidism   . Iron deficiency   . Osteopenia   . Overactive bladder    Past Surgical History: Past Surgical History:  Procedure Laterality Date  . CESAREAN SECTION    . DILATION AND CURETTAGE OF UTERUS  07/1996  . NASAL SEPTUM SURGERY  06/2011  . POLYPECTOMY   04/2012   uterine polyps  . SINOSCOPY     Medication List:  Current Outpatient Medications  Medication Sig Dispense Refill  . buPROPion (WELLBUTRIN XL) 150 MG 24 hr tablet Take 150 mg by mouth daily.    . Cholecalciferol (VITAMIN D3 PO) Take 1 tablet by mouth daily.    Marland Kitchen ezetimibe (ZETIA) 10 MG tablet Take 10 mg by mouth daily.  1  . levothyroxine (SYNTHROID, LEVOTHROID) 25 MCG tablet Take 37.5 mcg by mouth daily before breakfast.    . losartan-hydrochlorothiazide (HYZAAR) 100-12.5 MG tablet     . meloxicam (MOBIC) 15 MG tablet Take 15 mg by mouth daily.    Ailene Ards 3-6-9 Fatty Acids (OMEGA-3-6-9 PO) Take 1 tablet by mouth daily.    . pregabalin (LYRICA) 150 MG capsule Take 150 mg by mouth 2 (two) times daily.    Marland Kitchen pyridoxine (B-6) 100 MG tablet Take 100 mg by mouth daily.    . sertraline (ZOLOFT) 50 MG tablet     . tolterodine (DETROL LA) 4 MG 24 hr capsule Take 4 mg by mouth daily.    . traMADol (ULTRAM) 50 MG tablet Take 50 mg by mouth every 6 (six) hours as needed.    . vitamin B-12 (CYANOCOBALAMIN) 250 MCG tablet Take 250 mcg by mouth daily.    . Fluticasone Propionate (XHANCE) 93 MCG/ACT EXHU Place 1 spray into the nose 2 (two) times daily. 16 mL 5   No current facility-administered medications for this visit.    Allergies: No Known Allergies Social History: Social History   Socioeconomic History  . Marital status: Married    Spouse name: Not on file  . Number of children: 2  . Years of education: Not on file  . Highest education level: Not on file  Occupational History  . Occupation: Manufacturing systems engineer  Social Needs  . Financial resource strain: Not on file  . Food insecurity:    Worry: Not on file    Inability: Not on file  . Transportation needs:    Medical: Not on file    Non-medical: Not on file  Tobacco Use  . Smoking status: Never Smoker  . Smokeless tobacco: Never Used  Substance and Sexual Activity  . Alcohol use: No    Alcohol/week: 0.0 standard drinks    . Drug use: No  . Sexual activity: Not on file  Lifestyle  . Physical activity:    Days per week: Not on file    Minutes per session: Not on file  . Stress: Not on file  Relationships  . Social connections:    Talks on phone: Not on file    Gets together: Not on file    Attends religious service: Not on file    Active member of club or organization: Not on file    Attends meetings of clubs or organizations: Not on file    Relationship status: Not on file  Other Topics Concern  . Not on file  Social History Narrative  . Not on file   Lives in a 45+ year old house. Smoking: denies Occupation: Designer, industrial/product HistorySurveyor, minerals in the house: yes Carpet in the family room: no Carpet in the bedroom: no Heating: electric Cooling: central Pet: yes1 dog x 5 yrs, goes into bedroom and sleeps in the same bed.  Family History: Family History  Problem Relation Age of Onset  . Arrhythmia Father        AFIB  . Congestive Heart Failure Father   . Hypertension Father   . Hyperlipidemia Father   . Dementia Father   . Asthma Son   . Leukemia Maternal Grandfather   . Melanoma Maternal Grandmother   . Breast cancer Maternal Aunt   . Allergic rhinitis Neg Hx   . Eczema Neg Hx   . Urticaria Neg Hx    Problem  Relation Asthma                                   son Eczema                                no Food allergy                          No  Allergic rhino conjunctivitis     Son   Review of Systems  Constitutional: Negative for appetite change, chills, fever and unexpected weight change.  HENT: Positive for congestion, postnasal drip, rhinorrhea, sinus pressure and sneezing.   Eyes: Negative for itching.  Respiratory: Negative for cough, chest tightness, shortness of breath and wheezing.   Cardiovascular: Negative for chest pain.  Gastrointestinal: Negative for abdominal pain.  Genitourinary: Negative for difficulty  urinating.  Skin: Negative for rash.  Allergic/Immunologic: Positive for environmental allergies. Negative for food allergies.  Neurological: Positive for headaches.   Objective: BP 124/72 (BP Location: Left Arm, Patient Position: Sitting, Cuff Size: Normal)   Pulse 93   Temp 99.1 F (37.3 C) (Oral)   Resp 18   Ht 5' 7.1" (1.704 m)   Wt 225 lb 9.6 oz (102.3 kg)   SpO2 97%   BMI 35.23 kg/m  Body mass index is 35.23 kg/m. Physical Exam  Constitutional: She is oriented to person, place, and time. She appears well-developed and well-nourished.  HENT:  Head: Normocephalic and atraumatic.  Right Ear: External ear normal.  Left Ear: External ear normal.  Nose: Nose normal.  Mouth/Throat: Oropharynx is clear and moist.  Eyes: Conjunctivae and EOM are normal.  Neck: Neck supple.  Cardiovascular: Normal rate, regular rhythm and normal heart sounds. Exam reveals no gallop and no friction rub.  No murmur heard. Pulmonary/Chest: Effort normal and breath sounds normal. She has no wheezes. She has no rales.  Abdominal: Soft.  Lymphadenopathy:    She has no cervical adenopathy.  Neurological: She is alert and oriented to person, place, and time.  Skin: Skin is warm. No rash noted.  Psychiatric: She has a normal mood and affect. Her behavior is normal.  Nursing note and vitals reviewed.  The plan was reviewed with the patient/family, and all questions/concerned were addressed.  It was my pleasure to see John today and participate in her care. Please feel free to contact me with any questions or concerns.  Sincerely,  Wyline Mood, DO Allergy & Immunology  [facility] office: [phone] [facility] office:[phone]

## 2018-09-27 NOTE — Assessment & Plan Note (Signed)
Headaches on and off. Triggers include sound, light and certain scents.  Discussed with patient that she may have migraines given clinical history.  Follow up with PCP regarding this.

## 2018-09-30 ENCOUNTER — Encounter: Payer: Self-pay | Admitting: Allergy

## 2018-09-30 LAB — ALLERGENS W/TOTAL IGE AREA 2
Alternaria Alternata IgE: <0.1 kU/L
Bermuda Grass IgE: <0.1 kU/L
Cat Dander IgE: <0.1 kU/L
Cladosporium Herbarum IgE: <0.1 kU/L
Cottonwood IgE: <0.1 kU/L
Dog Dander IgE: <0.1 kU/L
Elm, American IgE: <0.1 kU/L
IgE (Immunoglobulin E), Serum: <2 IU/mL — ABNORMAL LOW (ref 6–495)
Johnson Grass IgE: <0.1 kU/L
Mouse Urine IgE: <0.1 kU/L
Pecan, Hickory IgE: <0.1 kU/L
Penicillium Chrysogen IgE: <0.1 kU/L
Pigweed, Rough IgE: <0.1 kU/L
Timothy Grass IgE: <0.1 kU/L
White Mulberry IgE: <0.1 kU/L

## 2018-10-01 ENCOUNTER — Other Ambulatory Visit: Payer: Self-pay | Admitting: Obstetrics and Gynecology

## 2018-10-01 NOTE — Patient Instructions (Addendum)
Your procedure is scheduled on:  Tuesday, 11/5 at 1:30 pm  Enter through the Main Entrance of [facility] at: 12 noon  Pick up the phone at the desk and dial [phone].  Call this number if you have problems the morning of surgery: [phone].  Remember: Do NOT eat food after midnight Monday  Do NOT drink clear liquids (including water) after: 7:30 am Tuesday 11/5  Take these medicines the morning of surgery with a SIP OF WATER: lyrica.  Ok to use nasal spray.  Brush your teeth on the day of surgery.  Stop herbal medications, vitamin supplements, Ibuprofen/NSAIDS 1 week prior to surgery - Wednesday 10/30  Do NOT wear jewelry (body piercing), metal hair clips/bobby pins, make-up, or nail polish. Do NOT wear lotions, powders, or perfumes.  You may wear deoderant. Do NOT shave for 48 hours prior to surgery. Do NOT bring valuables to the hospital.  Have a responsible adult drive you home and stay with you for 24 hours after your procedure.  Home with Mother Melissa Chandler cell [phone]

## 2018-10-04 ENCOUNTER — Encounter (HOSPITAL_COMMUNITY): Payer: Self-pay | Admitting: *Deleted

## 2018-10-04 ENCOUNTER — Encounter (HOSPITAL_COMMUNITY)
Admission: RE | Admit: 2018-10-04 | Discharge: 2018-10-04 | Disposition: A | Discharge status: Home / Self Care | Payer: 59 | Source: Ambulatory Visit | Attending: Obstetrics and Gynecology | Admitting: Obstetrics and Gynecology

## 2018-10-04 ENCOUNTER — Other Ambulatory Visit: Payer: Self-pay

## 2018-10-04 DIAGNOSIS — Z01818 Encounter for other preprocedural examination: Secondary | ICD-10-CM | POA: Diagnosis not present

## 2018-10-04 DIAGNOSIS — R9431 Abnormal electrocardiogram [ECG] [EKG]: Secondary | ICD-10-CM | POA: Insufficient documentation

## 2018-10-04 HISTORY — DX: Other seasonal allergic rhinitis: J30.2

## 2018-10-04 HISTORY — DX: Anxiety disorder, unspecified: F41.9

## 2018-10-04 HISTORY — DX: Insomnia, unspecified: G47.00

## 2018-10-04 LAB — CBC
HCT: 36.7 % (ref 36.0–46.0)
HEMOGLOBIN: 12.5 g/dL (ref 12.0–15.0)
MCH: 28.5 pg (ref 26.0–34.0)
MCHC: 34.1 g/dL (ref 30.0–36.0)
MCV: 83.6 fL (ref 80.0–100.0)
Platelets: 378 10*3/uL (ref 150–400)
RBC: 4.39 MIL/uL (ref 3.87–5.11)
RDW: 12.9 % (ref 11.5–15.5)
WBC: 7.9 10*3/uL (ref 4.0–10.5)
nRBC: 0 % (ref 0.0–0.2)

## 2018-10-04 LAB — BASIC METABOLIC PANEL
Anion gap: 8 (ref 5–15)
BUN: 14 mg/dL (ref 6–20)
CHLORIDE: 101 mmol/L (ref 98–111)
CO2: 27 mmol/L (ref 22–32)
Calcium: 9.5 mg/dL (ref 8.9–10.3)
Creatinine, Ser: 0.81 mg/dL (ref 0.44–1.00)
GFR calc non Af Amer: >60 mL/min (ref >60)
Glucose, Bld: 90 mg/dL (ref 70–99)
POTASSIUM: 3.5 mmol/L (ref 3.5–5.1)
SODIUM: 136 mmol/L (ref 135–145)

## 2018-10-06 ENCOUNTER — Other Ambulatory Visit (HOSPITAL_COMMUNITY): Payer: 59

## 2018-10-06 DIAGNOSIS — N3281 Overactive bladder: Secondary | ICD-10-CM | POA: Diagnosis not present

## 2018-10-06 DIAGNOSIS — I10 Essential (primary) hypertension: Secondary | ICD-10-CM | POA: Diagnosis not present

## 2018-10-06 DIAGNOSIS — E038 Other specified hypothyroidism: Secondary | ICD-10-CM | POA: Diagnosis not present

## 2018-10-06 DIAGNOSIS — E7800 Pure hypercholesterolemia, unspecified: Secondary | ICD-10-CM | POA: Diagnosis not present

## 2018-10-12 ENCOUNTER — Encounter (HOSPITAL_COMMUNITY): Payer: Self-pay

## 2018-10-12 ENCOUNTER — Ambulatory Visit (HOSPITAL_COMMUNITY): Payer: 59 | Admitting: Certified Registered Nurse Anesthetist

## 2018-10-12 ENCOUNTER — Ambulatory Visit (HOSPITAL_COMMUNITY)
Admission: RE | Admit: 2018-10-12 | Discharge: 2018-10-12 | Disposition: A | Discharge status: Home / Self Care | Payer: 59 | Source: Ambulatory Visit | Attending: Obstetrics and Gynecology | Admitting: Obstetrics and Gynecology

## 2018-10-12 ENCOUNTER — Other Ambulatory Visit: Payer: Self-pay

## 2018-10-12 ENCOUNTER — Encounter (HOSPITAL_COMMUNITY)
Admission: RE | Disposition: A | Discharge status: Home / Self Care | Payer: Self-pay | Source: Ambulatory Visit | Attending: Obstetrics and Gynecology

## 2018-10-12 DIAGNOSIS — F329 Major depressive disorder, single episode, unspecified: Secondary | ICD-10-CM | POA: Diagnosis not present

## 2018-10-12 DIAGNOSIS — Z791 Long term (current) use of non-steroidal anti-inflammatories (NSAID): Secondary | ICD-10-CM | POA: Diagnosis not present

## 2018-10-12 DIAGNOSIS — N938 Other specified abnormal uterine and vaginal bleeding: Secondary | ICD-10-CM | POA: Insufficient documentation

## 2018-10-12 DIAGNOSIS — N939 Abnormal uterine and vaginal bleeding, unspecified: Secondary | ICD-10-CM | POA: Diagnosis present

## 2018-10-12 DIAGNOSIS — F419 Anxiety disorder, unspecified: Secondary | ICD-10-CM | POA: Insufficient documentation

## 2018-10-12 DIAGNOSIS — I10 Essential (primary) hypertension: Secondary | ICD-10-CM | POA: Insufficient documentation

## 2018-10-12 DIAGNOSIS — E785 Hyperlipidemia, unspecified: Secondary | ICD-10-CM | POA: Insufficient documentation

## 2018-10-12 DIAGNOSIS — Z79890 Hormone replacement therapy (postmenopausal): Secondary | ICD-10-CM | POA: Diagnosis not present

## 2018-10-12 DIAGNOSIS — M797 Fibromyalgia: Secondary | ICD-10-CM | POA: Insufficient documentation

## 2018-10-12 DIAGNOSIS — E039 Hypothyroidism, unspecified: Secondary | ICD-10-CM | POA: Insufficient documentation

## 2018-10-12 DIAGNOSIS — N840 Polyp of corpus uteri: Secondary | ICD-10-CM | POA: Diagnosis not present

## 2018-10-12 DIAGNOSIS — Z79899 Other long term (current) drug therapy: Secondary | ICD-10-CM | POA: Insufficient documentation

## 2018-10-12 HISTORY — PX: DILATATION & CURETTAGE/HYSTEROSCOPY WITH MYOSURE: SHX6511

## 2018-10-12 HISTORY — PX: HYSTEROSCOPY: SHX211

## 2018-10-12 SURGERY — DILATATION & CURETTAGE/HYSTEROSCOPY WITH MYOSURE
Anesthesia: General | Site: Vagina

## 2018-10-12 MED ORDER — LIDOCAINE HCL (CARDIAC) PF 100 MG/5ML IV SOSY
PREFILLED_SYRINGE | INTRAVENOUS | Status: DC | PRN
  Administered 2018-10-12: 100 mg via INTRAVENOUS

## 2018-10-12 MED ORDER — FENTANYL CITRATE (PF) 100 MCG/2ML IJ SOLN
INTRAMUSCULAR | Status: DC | PRN
  Administered 2018-10-12 (×2): 50 ug via INTRAVENOUS

## 2018-10-12 MED ORDER — DEXAMETHASONE SODIUM PHOSPHATE 10 MG/ML IJ SOLN
INTRAMUSCULAR | Status: DC | PRN
  Administered 2018-10-12: 10 mg via INTRAVENOUS

## 2018-10-12 MED ORDER — FENTANYL CITRATE (PF) 100 MCG/2ML IJ SOLN
INTRAMUSCULAR | Status: AC
  Filled 2018-10-12: qty 2

## 2018-10-12 MED ORDER — FENTANYL CITRATE (PF) 100 MCG/2ML IJ SOLN
25.0000 ug | INTRAMUSCULAR | Status: DC | PRN
  Administered 2018-10-12: 50 ug via INTRAVENOUS

## 2018-10-12 MED ORDER — ONDANSETRON HCL 4 MG/2ML IJ SOLN
INTRAMUSCULAR | Status: AC
  Filled 2018-10-12: qty 2

## 2018-10-12 MED ORDER — DEXAMETHASONE SODIUM PHOSPHATE 10 MG/ML IJ SOLN
INTRAMUSCULAR | Status: AC
  Filled 2018-10-12: qty 1

## 2018-10-12 MED ORDER — LIDOCAINE HCL (CARDIAC) PF 100 MG/5ML IV SOSY
PREFILLED_SYRINGE | INTRAVENOUS | Status: AC
  Filled 2018-10-12: qty 5

## 2018-10-12 MED ORDER — LACTATED RINGERS IV SOLN
INTRAVENOUS | Status: DC
  Administered 2018-10-12 (×2): via INTRAVENOUS

## 2018-10-12 MED ORDER — MIDAZOLAM HCL 2 MG/2ML IJ SOLN
INTRAMUSCULAR | Status: DC | PRN
  Administered 2018-10-12: 2 mg via INTRAVENOUS

## 2018-10-12 MED ORDER — PROPOFOL 10 MG/ML IV BOLUS
INTRAVENOUS | Status: DC | PRN
  Administered 2018-10-12: 150 mg via INTRAVENOUS

## 2018-10-12 MED ORDER — PROPOFOL 10 MG/ML IV BOLUS
INTRAVENOUS | Status: AC
  Filled 2018-10-12: qty 20

## 2018-10-12 MED ORDER — SCOPOLAMINE 1 MG/3DAYS TD PT72
MEDICATED_PATCH | TRANSDERMAL | Status: AC
  Administered 2018-10-12: 1.5 mg via TRANSDERMAL
  Filled 2018-10-12: qty 1

## 2018-10-12 MED ORDER — GLYCOPYRROLATE 0.2 MG/ML IJ SOLN
INTRAMUSCULAR | Status: AC
  Filled 2018-10-12: qty 1

## 2018-10-12 MED ORDER — KETOROLAC TROMETHAMINE 30 MG/ML IJ SOLN
INTRAMUSCULAR | Status: AC
  Filled 2018-10-12: qty 1

## 2018-10-12 MED ORDER — ONDANSETRON HCL 4 MG/2ML IJ SOLN
INTRAMUSCULAR | Status: DC | PRN
  Administered 2018-10-12: 4 mg via INTRAVENOUS

## 2018-10-12 MED ORDER — KETOROLAC TROMETHAMINE 30 MG/ML IJ SOLN
INTRAMUSCULAR | Status: DC | PRN
  Administered 2018-10-12: 30 mg via INTRAMUSCULAR
  Administered 2018-10-12: 30 mg via INTRAVENOUS

## 2018-10-12 MED ORDER — GLYCOPYRROLATE 0.2 MG/ML IJ SOLN
INTRAMUSCULAR | Status: DC | PRN
  Administered 2018-10-12: 0.2 mg via INTRAVENOUS

## 2018-10-12 MED ORDER — SCOPOLAMINE 1 MG/3DAYS TD PT72
1.0000 | MEDICATED_PATCH | Freq: Once | TRANSDERMAL | Status: DC
  Administered 2018-10-12: 1.5 mg via TRANSDERMAL

## 2018-10-12 MED ORDER — MIDAZOLAM HCL 2 MG/2ML IJ SOLN
INTRAMUSCULAR | Status: AC
  Filled 2018-10-12: qty 2

## 2018-10-12 SURGICAL SUPPLY — 15 items
CANISTER SUCT 3000ML PPV (MISCELLANEOUS) ×2 IMPLANT
CATH ROBINSON RED A/P 16FR (CATHETERS) ×2 IMPLANT
DEVICE MYOSURE LITE (MISCELLANEOUS) IMPLANT
DEVICE MYOSURE REACH (MISCELLANEOUS) ×2 IMPLANT
FILTER ARTHROSCOPY CONVERTOR (FILTER) ×2 IMPLANT
GLOVE BIOGEL PI IND STRL 7.0 (GLOVE) ×2 IMPLANT
GLOVE BIOGEL PI INDICATOR 7.0 (GLOVE) ×2
GLOVE ECLIPSE 6.5 STRL STRAW (GLOVE) ×2 IMPLANT
GOWN STRL REUS W/TWL LRG LVL3 (GOWN DISPOSABLE) ×4 IMPLANT
PACK VAGINAL MINOR WOMEN LF (CUSTOM PROCEDURE TRAY) ×2 IMPLANT
PAD OB MATERNITY 4.3X12.25 (PERSONAL CARE ITEMS) ×2 IMPLANT
SEAL ROD LENS SCOPE MYOSURE (ABLATOR) ×2 IMPLANT
TOWEL OR 17X24 6PK STRL BLUE (TOWEL DISPOSABLE) ×4 IMPLANT
TUBING AQUILEX INFLOW (TUBING) ×2 IMPLANT
TUBING AQUILEX OUTFLOW (TUBING) ×2 IMPLANT

## 2018-10-12 NOTE — H&P (Addendum)
Melissa Chandler is an 42 y.o. female G3P2 Female with AUB and endometrial masses noted on sonohysterogram now presents for surgical mgmt of these findings  Pertinent Gynecological History: Menses: flow is moderate Bleeding: dysfunctional uterine bleeding Contraception: none DES exposure: denies Blood transfusions: none Sexually transmitted diseases: no past history Previous GYN Procedures: DNC  Last mammogram: normal Date: 06/2018 Last pap: normal Date: 05/2018 OB History: G3P2   Menstrual History: Menarche age: n/a Patient's last menstrual period was 09/30/2018 (exact date).    Past Medical History:  Diagnosis Date  . Anxiety   . Depression   . Eczema   . Fibromyalgia   . HTN (hypertension)   . Hyperlipidemia   . Hypothyroidism   . Insomnia   . Iron deficiency   . Osteopenia   . Overactive bladder   . Seasonal allergies   . SVD (spontaneous vaginal delivery)    x 1    Past Surgical History:  Procedure Laterality Date  . CESAREAN SECTION  2002   x 1  . DILATION AND CURETTAGE OF UTERUS  07/1996   mab  . NASAL SEPTUM SURGERY  06/2011  . POLYPECTOMY  04/2012   D & C hysteroscopy for uterine polyps  . SINOSCOPY    . WISDOM TOOTH EXTRACTION      Family History  Problem Relation Age of Onset  . Arrhythmia Father        AFIB  . Congestive Heart Failure Father   . Hypertension Father   . Hyperlipidemia Father   . Dementia Father   . Asthma Son   . Leukemia Maternal Grandfather   . Melanoma Maternal Grandmother   . Breast cancer Maternal Aunt   . Allergic rhinitis Neg Hx   . Eczema Neg Hx   . Urticaria Neg Hx     Social History:  reports that she has never smoked. She has never used smokeless tobacco. She reports that she drinks alcohol. She reports that she does not use drugs.  Allergies: No Known Allergies  No medications prior to admission.    Review of Systems  All other systems reviewed and are negative.   Last menstrual period 09/30/2018. Physical  Exam  Constitutional: She appears well-developed and well-nourished.  HENT:  Head: Normocephalic and atraumatic.  Eyes: EOM are normal.  Neck: Neck supple.  Cardiovascular: Regular rhythm.  Respiratory: Effort normal.  GI: Soft.  Genitourinary: Vagina normal.  Genitourinary Comments: Cervix closed And no palp mas Uterus sl enlarged  Musculoskeletal: Normal range of motion.  Neurological: She is alert.  Skin: Skin is warm and dry.  Psychiatric: She has a normal mood and affect.    No results found for this or any previous visit (from the past 24 hour(s)).  No results found.  Assessment/Plan: AUB Endometrial mass P) dx hysteroscopy, D&C hysteroscopic resection of endometrial masses. Risk of surgery reviewed including infection, bleeding, uterine perforation and its risk( 12/998) , fluid overload and its mgmt, thermal injury, poss need for blood transfusion and its risk. All ? answered  Lauralyn Shadowens A Osmond Steckman 10/12/2018, 5:13 AM  Addendum: I have re examined pt. no change since last visit

## 2018-10-12 NOTE — Transfer of Care (Signed)
Immediate Anesthesia Transfer of Care Note  Patient: Melissa Chandler  Procedure(s) Performed: DILATATION & CURETTAGE/HYSTEROSCOPY WITH MYOSURE (N/A Vagina )  Patient Location: PACU  Anesthesia Type:General  Level of Consciousness: awake, alert  and oriented  Airway & Oxygen Therapy: Patient Spontanous Breathing and Patient connected to nasal cannula oxygen  Post-op Assessment: Report given to RN, Post -op Vital signs reviewed and stable and Patient moving all extremities X 4  Post vital signs: Reviewed and stable  Last Vitals:  Vitals Value Taken Time  BP 127/81 10/12/2018  2:07 PM  Temp    Pulse 93 10/12/2018  2:09 PM  Resp 18 10/12/2018  2:09 PM  SpO2 100 % 10/12/2018  2:09 PM  Vitals shown include unvalidated device data.  Last Pain:  Vitals:   10/12/18 1216  TempSrc: Oral  PainSc: 0-No pain      Patients Stated Pain Goal: 3 (10/12/18 1216)  Complications: No apparent anesthesia complications

## 2018-10-12 NOTE — Anesthesia Preprocedure Evaluation (Addendum)
Anesthesia Evaluation  Patient identified by MRN, date of birth, ID band Patient confused    Reviewed: Allergy & Precautions, NPO status , Patient's Chart, lab work & pertinent test results  Airway Mallampati: II  TM Distance: >3 FB Neck ROM: Full    Dental no notable dental hx. (+) Teeth Intact, Dental Advisory Given   Pulmonary neg pulmonary ROS,    Pulmonary exam normal breath sounds clear to auscultation       Cardiovascular hypertension, Pt. on medications negative cardio ROS Normal cardiovascular exam Rhythm:Regular Rate:Normal     Neuro/Psych  Headaches, PSYCHIATRIC DISORDERS Anxiety Depression    GI/Hepatic negative GI ROS, Neg liver ROS,   Endo/Other  Hypothyroidism   Renal/GU negative Renal ROS  negative genitourinary   Musculoskeletal  (+) Fibromyalgia -  Abdominal   Peds  Hematology negative hematology ROS (+)   Anesthesia Other Findings Abnormal bleeding, endometrial mass  Reproductive/Obstetrics                            Anesthesia Physical Anesthesia Plan  ASA: III  Anesthesia Plan: General   Post-op Pain Management:    Induction: Intravenous  PONV Risk Score and Plan: 3 and Dexamethasone, Ondansetron, Scopolamine patch - Pre-op and Midazolam  Airway Management Planned: LMA  Additional Equipment:   Intra-op Plan:   Post-operative Plan: Extubation in OR  Informed Consent: I have reviewed the patients History and Physical, chart, labs and discussed the procedure including the risks, benefits and alternatives for the proposed anesthesia with the patient or authorized representative who has indicated his/her understanding and acceptance.   Dental advisory given  Plan Discussed with: CRNA  Anesthesia Plan Comments:         Anesthesia Quick Evaluation

## 2018-10-12 NOTE — Op Note (Signed)
NAMEBASSHEVA, FLURY MEDICAL RECORD ZO:[id number] ACCOUNT [id number] DATE OF BIRTH:Jul 31, 1976 FACILITY: [facility] LOCATION: [facility] PHYSICIAN:Sahas Sluka A. Jordis Repetto, MD  OPERATIVE REPORT  DATE OF PROCEDURE:  10/12/2018  PREOPERATIVE DIAGNOSIS:  Abnormal uterine bleeding, endometrial mass.  PROCEDURE PERFORMED:  Diagnostic hysteroscopy, hysteroscopic resection of endometrial masses, dilation and curettage.  POSTOPERATIVE DIAGNOSIS:  Abnormal uterine bleeding, endometrial polyps.  ANESTHESIA:  General.  SURGEON:  Maxie Better, MD  ASSISTANT:  None.  DESCRIPTION OF PROCEDURE:  Under adequate general anesthesia, the patient was placed in the dorsal lithotomy position.  She was sterilely prepped and draped in the usual fashion.  The bladder was catheterized for 30 mL of urine.  Examination under anesthesia revealed an anteverted uterus, slightly enlarged.  No adnexal masses could be appreciated.  A bivalve speculum was placed in the vagina.  A single-tooth tenaculum was placed on the anterior lip of the cervix.  The cervix easily accepted a #17  dilator.  The cervix was parous.  The diagnostic hysteroscope was introduced into the uterine cavity.  The left tubal ostia could be seen.  On the right side anteriorly was a large endometrial polyp, and posteriorly there were several also smaller endometrial polyps.  The endocervical canal was without any lesions.  Using the Reach MyoSure resectoscope, all those lesions were removed, as was the endometrial lining circumferentially.  When this was felt to be adequate and complete, the endocervical canal was once again inspected, and all instruments were then removed from the vagina.  SPECIMEN:  Labeled endometrial curettings with polyp were sent to pathology.  ESTIMATED BLOOD LOSS:  Minimal.  COMPLICATIONS:  None.  The patient tolerated the procedure well and was transferred to recovery room in stable condition.  LN/NUANCE  D:10/12/2018  T:10/12/2018 JOB:[id number]

## 2018-10-12 NOTE — Discharge Instructions (Signed)
DISCHARGE INSTRUCTIONS: D&C / Hysteroscopy/Post Anesthesia Home Care Instructions  The following instructions have been prepared to help you care for yourself upon your return home.  CALL  IF TEMP>100.4, NOTHING PER VAGINA X 1 WK, CALL IF SOAKING A MAXI  PAD EVERY HOUR OR MORE FREQUENTLY  You may take Ibuprofen AFTER 8 pm tonight, October 12, 2018.  You may remove Scop patch on or before Friday, October 15, 2018.  Personal hygiene:  Use sanitary pads for vaginal drainage, not tampons.  Shower the day after your procedure.  NO tub baths, pools or Jacuzzis for 2-3 weeks.  Wipe front to back after using the bathroom.  Activity and limitations:  Get plenty of rest for the remainder of the day. A responsible individual must stay with you for 24 hours following the procedure.  For the next 24 hours, DO NOT: -Drive a car -Advertising copywriter -Drink alcoholic beverages -Take any medication unless instructed by your physician -Make any legal decisions or sign important papers.  Do NOT rest in bed all day.  Walking is encouraged.  Walk up and down stairs slowly.  You may resume your normal activity in one to two days or as indicated by your physician.  Sexual activity: NO intercourse for at least 2 weeks after the procedure, or as indicated by your physician.  Return to work: You may resume your work activities in one to two days or as indicated by your doctor.  What to expect after your surgery: Expect to have vaginal bleeding/discharge for 2-3 days and spotting for up to 10 days. It is not unusual to have soreness for up to 1-2 weeks. You may have a slight burning sensation when you urinate for the first day. Mild cramps may continue for a couple of days. You may have a regular period in 2-6 weeks.  Call your doctor for any of the following:  Excessive vaginal bleeding, saturating and changing one pad every hour.  Inability to urinate 6 hours after discharge from hospital.   Pain not relieved by pain medication.  Fever of 100.4 F or greater.  Unusual vaginal discharge or odor.  Meals: Start with liquid foods such as gelatin or soup. Progress to regular foods as tolerated. Avoid greasy, spicy, heavy foods. If nausea and/or vomiting occur, drink only clear liquids until the nausea and/or vomiting subsides. Call your physician if vomiting continues.  Special Instructions/Symptoms: Your throat may feel dry or sore from the anesthesia or the breathing tube placed in your throat during surgery. If this causes discomfort, gargle with warm salt water. The discomfort should disappear within 24 hours.  If you had a scopolamine patch placed behind your ear for the management of post- operative nausea and/or vomiting:  1. The medication in the patch is effective for 72 hours, after which it should be removed.  Wrap patch in a tissue and discard in the trash. Wash hands thoroughly with soap and water. 2. You may remove the patch earlier than 72 hours if you experience unpleasant side effects which may include dry mouth, dizziness or visual disturbances. 3. Avoid touching the patch. Wash your hands with soap and water after contact with the patch.

## 2018-10-12 NOTE — Brief Op Note (Signed)
10/12/2018  2:06 PM  PATIENT:  Melissa Chandler  42 y.o. female  PRE-OPERATIVE DIAGNOSIS:  Abnormal Uterine Bleeding, Endometrial Mass  POST-OPERATIVE DIAGNOSIS:  abnormal uterine bleeding, endometrial polyps  PROCEDURE:  Diagnostic hysteroscopy, D&C, hysteroscopic resection of endometrial polyps  SURGEON:  Surgeon(s) and Role:    * Advay Volante, Nena Jordan, MD - Primary  PHYSICIAN ASSISTANT:   ASSISTANTS: none   ANESTHESIA:   general Findings: multiple endometrial polyps, left tubal ostia seen, right tubal ostia sl sclerosed, nl endocervical canal EBL:  5 mL   BLOOD ADMINISTERED:none  DRAINS: none   LOCAL MEDICATIONS USED:  NONE  SPECIMEN:  Source of Specimen:  EMC with endometrial polyps  DISPOSITION OF SPECIMEN:  PATHOLOGY  COUNTS:  YES  TOURNIQUET:  * No tourniquets in log *  DICTATION: .Other Dictation: Dictation Number [id number]  PLAN OF CARE: Discharge to home after PACU  PATIENT DISPOSITION:  PACU - hemodynamically stable.   Delay start of Pharmacological VTE agent (>24hrs) due to surgical blood loss or risk of bleeding: no

## 2018-10-12 NOTE — Anesthesia Procedure Notes (Signed)
Procedure Name: LMA Insertion Date/Time: 10/12/2018 1:29 PM Performed by: Elmer Picker, MD Pre-anesthesia Checklist: Patient identified, Patient being monitored, Emergency Drugs available, Timeout performed and Suction available Patient Re-evaluated:Patient Re-evaluated prior to induction Oxygen Delivery Method: Circle System Utilized Preoxygenation: Pre-oxygenation with 100% oxygen Induction Type: IV induction Ventilation: Mask ventilation without difficulty LMA: LMA inserted LMA Size: 4.0 Number of attempts: 1 Placement Confirmation: positive ETCO2 and breath sounds checked- equal and bilateral

## 2018-10-13 ENCOUNTER — Encounter: Payer: Self-pay | Admitting: Neurology

## 2018-10-13 ENCOUNTER — Ambulatory Visit (INDEPENDENT_AMBULATORY_CARE_PROVIDER_SITE_OTHER): Payer: 59 | Admitting: Neurology

## 2018-10-13 VITALS — BP 128/82 | HR 81 | Ht 68.0 in | Wt 224.0 lb

## 2018-10-13 DIAGNOSIS — H814 Vertigo of central origin: Secondary | ICD-10-CM

## 2018-10-13 DIAGNOSIS — R51 Headache with orthostatic component, not elsewhere classified: Secondary | ICD-10-CM

## 2018-10-13 DIAGNOSIS — G43809 Other migraine, not intractable, without status migrainosus: Secondary | ICD-10-CM | POA: Insufficient documentation

## 2018-10-13 DIAGNOSIS — G43109 Migraine with aura, not intractable, without status migrainosus: Secondary | ICD-10-CM

## 2018-10-13 DIAGNOSIS — G43009 Migraine without aura, not intractable, without status migrainosus: Secondary | ICD-10-CM | POA: Diagnosis not present

## 2018-10-13 DIAGNOSIS — H53459 Other localized visual field defect, unspecified eye: Secondary | ICD-10-CM

## 2018-10-13 DIAGNOSIS — H539 Unspecified visual disturbance: Secondary | ICD-10-CM

## 2018-10-13 DIAGNOSIS — R519 Headache, unspecified: Secondary | ICD-10-CM

## 2018-10-13 MED ORDER — ONDANSETRON 4 MG PO TBDP: 4.0000 mg | ORAL_TABLET | Freq: Three times a day (TID) | ORAL | 3 refills | Status: AC | PRN

## 2018-10-13 MED ORDER — RIZATRIPTAN BENZOATE 10 MG PO TBDP: 10.0000 mg | ORAL_TABLET | ORAL | 11 refills | Status: DC | PRN

## 2018-10-13 NOTE — Progress Notes (Signed)
[facility]    Provider:  Dr Lucia Gaskins Referring Provider: Jarrett Soho, PA-C Primary Care Physician:  Jarrett Soho, PA-C  CC:  migraines  HPI:  Melissa Chandler is a 42 y.o. female here as requested by Dr. Delena Serve for migraine.  PMHx hypercholesterolemia, osteopenia, depression, hypertension, fibromyalgia, iron deficiency anemia, tinnitus of both ears, chronic constipation, hypothyroidism, restless legs. Symptoms started in her 41s. She has light sensitivity, sound sensitivity, smells can make her nauseous and she has headaches more in the frontal area and behind the eyes and at the base of her skull. She has the symptoms up to 3x a week, lasts all day long, zofran helps. Sleep helps. The headaches can be pounding and throbbing and may be unilateral. Sometimes the symptoms preceded headache other times not.  She just started to have peripheral vision loss, black and fuzzy and then a severe headache and vomiting. Movement makes it worse. She wakes up with headaces and if she bends over the headache is worse. No FHx of migraines. She has episodes of vertigo. Son andmother has migraines.No other focal neurologic deficits, associated symptoms, inciting events or modifiable factors.  meds tried: Lyrica, zoloft, topamax  Reviewed notes, labs and imaging from outside physicians, which showed:  Reviewed notes from referring physician Jarrett Soho.  Patient was seen at the [facility] [place] in also seen by Dr. Haroldine Laws for the evaluation of environmental allergies and sinusitis.  These can trigger her headaches.  Headaches are on and off, triggers include sound light and certain scents.  She said ringing in her ears for years.  She saw Dr. Haroldine Laws for this.  She also has some vertigo and being off balance.  She has allergy issues.  She is had sinus surgery and septal surgery back in [place].  Examination at Dr. Haroldine Laws showed that the ears are  perfectly clear but audiogram shows a mild conductive loss of the right ear in the lower frequency she does complain of tinnitus and mild vertigo.  Dr. Haroldine Laws as well as her PCP and the allergy specialist suggested she may have migraines.  Per her PCP, headaches feel dull aching occurring over the frontal sinus and base of the skull she does note light sensitivity, phono and photophobia with nausea sometimes she has a fuzzy TV effect of her eyes and her peripheral vision and then has a bad headache.  Usually makes her sick to her stomach.  Taking Zofran which helps.  BMP normal  Review of Systems: Patient complains of symptoms per HPI as well as the following symptoms: vertigo, dizziness. Pertinent negatives and positives per HPI. All others negative.   Social History   Socioeconomic History  . Marital status: Legally Separated    Spouse name: Not on file  . Number of children: 2  . Years of education: 90  . Highest education level: Some college, no degree  Occupational History  . Occupation: Manufacturing systems engineer  Social Needs  . Financial resource strain: Not on file  . Food insecurity:    Worry: Not on file    Inability: Not on file  . Transportation needs:    Medical: Not on file    Non-medical: Not on file  Tobacco Use  . Smoking status: Never Smoker  . Smokeless tobacco: Never Used  Substance and Sexual Activity  . Alcohol use: Yes    Alcohol/week: 0.0 standard drinks    Comment: socially/holidays  . Drug use: Never  . Sexual activity: Not Currently  Birth control/protection: None  Lifestyle  . Physical activity:    Days per week: Not on file    Minutes per session: Not on file  . Stress: Not on file  Relationships  . Social connections:    Talks on phone: Not on file    Gets together: Not on file    Attends religious service: Not on file    Active member of club or organization: Not on file    Attends meetings of clubs or organizations: Not on file     Relationship status: Not on file  . Intimate partner violence:    Fear of current or ex partner: Not on file    Emotionally abused: Not on file    Physically abused: Not on file    Forced sexual activity: Not on file  Other Topics Concern  . Not on file  Social History Narrative   Lives at home with her 2 children   Separated 2 years   Right handed   Caffeine: quit October 2019    Family History  Problem Relation Age of Onset  . Arrhythmia Father        AFIB  . Congestive Heart Failure Father   . Hypertension Father   . Hyperlipidemia Father   . Dementia Father   . Cirrhosis Father   . Asthma Son   . Leukemia Maternal Grandfather   . Heart attack Maternal Grandfather   . Melanoma Maternal Grandmother   . Breast cancer Maternal Aunt   . Osteoporosis Mother   . Ovarian cancer Paternal Grandmother   . Breast cancer Other        maternal great aunt  . Allergic rhinitis Neg Hx   . Eczema Neg Hx   . Urticaria Neg Hx     Past Medical History:  Diagnosis Date  . Anxiety   . Cervical stenosis of spine   . Depression   . Eczema   . Fibromyalgia   . HTN (hypertension)   . Hyperlipidemia   . Hypothyroidism   . Insomnia   . Iron deficiency   . Nonallergic rhinitis   . Osteopenia   . Overactive bladder   . Seasonal allergies   . SVD (spontaneous vaginal delivery)    x 1  . Upper airway cough syndrome   . Vertigo   . Vitiligo     Past Surgical History:  Procedure Laterality Date  . CESAREAN SECTION  2002   x 1  . DILATION AND CURETTAGE OF UTERUS  07/1996   mab  . HYSTEROSCOPY  10/12/2018   with D&C  . HYSTEROSCOPY  2014  . NASAL SEPTUM SURGERY  06/2011  . POLYPECTOMY  04/2012   D & C hysteroscopy for uterine polyps  . SINOSCOPY    . WISDOM TOOTH EXTRACTION      Current Outpatient Medications  Medication Sig Dispense Refill  . buPROPion (WELLBUTRIN XL) 150 MG 24 hr tablet Take 150 mg by mouth at bedtime.     . Cholecalciferol (VITAMIN D3 PO) Take 1 tablet  by mouth daily.    Marland Kitchen ezetimibe (ZETIA) 10 MG tablet Take 10 mg by mouth at bedtime.   1  . fexofenadine-pseudoephedrine (ALLEGRA-D) 60-120 MG 12 hr tablet Take 1 tablet by mouth at bedtime.    . Fluticasone Propionate (XHANCE) 93 MCG/ACT EXHU Place 1 spray into the nose 2 (two) times daily. 16 mL 5  . levothyroxine (SYNTHROID, LEVOTHROID) 25 MCG tablet Take 37.5 mcg by mouth at bedtime.     Marland Kitchen  losartan-hydrochlorothiazide (HYZAAR) 100-12.5 MG tablet Take 1 tablet by mouth at bedtime.     . meloxicam (MOBIC) 15 MG tablet Take 15 mg by mouth at bedtime.     Ailene Ards 3-6-9 Fatty Acids (OMEGA-3-6-9 PO) Take 1 tablet by mouth at bedtime.     . pregabalin (LYRICA) 150 MG capsule Take 150 mg by mouth 2 (two) times daily.    Marland Kitchen pyridoxine (B-6) 100 MG tablet Take 100 mg by mouth at bedtime.     . sertraline (ZOLOFT) 50 MG tablet at bedtime.     . tolterodine (DETROL LA) 4 MG 24 hr capsule Take 4 mg by mouth at bedtime.     . traMADol (ULTRAM) 50 MG tablet Take 50 mg by mouth every 6 (six) hours as needed.    . TURMERIC PO Take 1 capsule by mouth at bedtime.    . vitamin B-12 (CYANOCOBALAMIN) 250 MCG tablet Take 250 mcg by mouth at bedtime.     . ondansetron (ZOFRAN-ODT) 4 MG disintegrating tablet Take 1 tablet (4 mg total) by mouth every 8 (eight) hours as needed for nausea. 30 tablet 3  . rizatriptan (MAXALT-MLT) 10 MG disintegrating tablet Take 1 tablet (10 mg total) by mouth as needed for migraine. May repeat in 2 hours if needed. Max 2x a day. 9 tablet 11  . topiramate (TOPAMAX) 25 MG tablet      No current facility-administered medications for this visit.     Allergies as of 10/13/2018  . (No Known Allergies)    Vitals: BP 128/82 (BP Location: Left Arm, Patient Position: Sitting)   Pulse 81   Ht 5\' 8"  (1.727 m)   Wt 224 lb (101.6 kg)   LMP 09/30/2018 (Exact Date)   BMI 34.06 kg/m  Last Weight:  Wt Readings from Last 1 Encounters:  10/13/18 224 lb (101.6 kg)   Last Height:   Ht  Readings from Last 1 Encounters:  10/13/18 5\' 8"  (1.727 m)   Physical exam: Exam: Gen: NAD, conversant, well nourised, obese, well groomed                     CV: RRR, no MRG. No Carotid Bruits. No peripheral edema, warm, nontender Eyes: Conjunctivae clear without exudates or hemorrhage  Neuro: Detailed Neurologic Exam  Speech:    Speech is normal; fluent and spontaneous with normal comprehension.  Cognition:    The patient is oriented to person, place, and time;     recent and remote memory intact;     language fluent;     normal attention, concentration,     fund of knowledge Cranial Nerves:    The pupils are equal, round, and reactive to light. The fundi are normal and spontaneous venous pulsations are present. Visual fields are full to finger confrontation. Extraocular movements are intact. Trigeminal sensation is intact and the muscles of mastication are normal. The face is symmetric. The palate elevates in the midline. Hearing intact. Voice is normal. Shoulder shrug is normal. The tongue has normal motion without fasciculations.   Coordination:    Normal finger to nose and heel to shin. Normal rapid alternating movements.   Gait:    Heel-toe and tandem gait are normal.   Motor Observation:    No asymmetry, no atrophy, and no involuntary movements noted. Tone:    Normal muscle tone.    Posture:    Posture is normal. normal erect    Strength:    Strength is V/V in  the upper and lower limbs.      Sensation: intact to LT     Reflex Exam:  DTR's:    Deep tendon reflexes in the upper and lower extremities are brisk bilaterally.   Toes:    The toes are downgoing bilaterally.   Clonus:    Clonus in Ajs.       Assessment/Plan:  Patient with migraines with and with aura associated with vertigo and dizziness (aka vestibular migraines) however needs thorough eval due to concenring syptoms  Acute management: She is happy with zofran. Start Maxalt.  Migraine  preventative: Recently started on Topiramate, recommend starting at 25mg  and slowly increasing to 50mg  and then 100mg   MRI brain due to concerning symptoms of morning headaches, positional headaches,peripheral vision changes, vertigo  to look for space occupying mass, chiari or intracranial hypertension (pseudotumor), schwannoma  Orders Placed This Encounter  Procedures  . MR BRAIN W WO CONTRAST    Reflexes are brisk, she has 2 beats clonus bilat which could ne normal for age. She follows for her cervical stenosis with a physician already, advised MRI c-spine and follow up there to discuss. Michael Roche follows and she is aware of cervical stenosis and sequelae such as paralysis declines further workup with [place] will see other provider.    Discussed: There is increased risk for stroke in women with migraine with aura and a  Contraindication for the combined contraceptive pill for use by women who have migraine with aura, which is in line with World Health Organisation recommendations. The risk for women with migraine without aura is lower and other risk factors like smoking are far more likely to increase stroke risk than migraine. There is a recommendation for no smoking and for the use of low estrogen or progestogen only pills particularly for women with migraine with aura. It is important however that women with migraine who are taking the pill do not decide to suddenly stop taking it without discussing this with their doctor. Please discuss with her OB/GYN.  Discussed: To prevent or relieve headaches, try the following: Cool Compress. Lie down and place a cool compress on your head.  Avoid headache triggers. If certain foods or odors seem to have triggered your migraines in the past, avoid them. A headache diary might help you identify triggers.  Include physical activity in your daily routine. Try a daily walk or other moderate aerobic exercise.  Manage stress. Find healthy ways to cope with the  stressors, such as delegating tasks on your to-do list.  Practice relaxation techniques. Try deep breathing, yoga, massage and visualization.  Eat regularly. Eating regularly scheduled meals and maintaining a healthy diet might help prevent headaches. Also, drink plenty of fluids.  Follow a regular sleep schedule. Sleep deprivation might contribute to headaches Consider biofeedback. With this mind-body technique, you learn to control certain bodily functions - such as muscle tension, heart rate and blood pressure - to prevent headaches or reduce headache pain.    Proceed to emergency room if you experience new or worsening symptoms or symptoms do not resolve, if you have new neurologic symptoms or if headache is severe, or for any concerning symptom.   Provided education and documentation from American headache Society toolbox including articles on: chronic migraine medication overuse headache, chronic migraines, prevention of migraines, behavioral and other nonpharmacologic treatments for headache.  Cc: Irene Shipper, MD  [facility] [address] [place], [place] [address]  Phone [phone] Fax [phone]

## 2018-10-13 NOTE — Patient Instructions (Addendum)
Preventative: Topamax 25mg . Continue to increase to 50mg  and then 100mg  Acute: Zofran and Maxalt. Please take one tablet Maxalt(Rizatriptan) at the onset of your headache. If it does not improve the symptoms please take one additional tablet. Do not take more then 2 tablets in 24hrs. Do not take use more then 2 to 3 times in a week.   There is increased risk for stroke in women with migraine with aura and a  Contraindication for the combined contraceptive pill for use by women who have migraine with aura, which is in line with World Health Organisation recommendations. The risk for women with migraine without aura is lower and other risk factors like smoking are far more likely to increase stroke risk than migraine. There is a recommendation for no smoking and for the use of low estrogen or progestogen only pills particularly for women with migraine with aura. It is important however that women with migraine who are taking the pill do not decide to suddenly stop taking it without discussing this with their doctor. Please discuss with her OB/GYN.  Topiramate tablets What is this medicine? TOPIRAMATE (toe PYRE a mate) is used to treat seizures in adults or children with epilepsy. It is also used for the prevention of migraine headaches. This medicine may be used for other purposes; ask your health care provider or pharmacist if you have questions. COMMON BRAND NAME(S): Topamax, Topiragen What should I tell my health care provider before I take this medicine? They need to know if you have any of these conditions: -bleeding disorders -cirrhosis of the liver or liver disease -diarrhea -glaucoma -kidney stones or kidney disease -low blood counts, like low white cell, platelet, or red cell counts -lung disease like asthma, obstructive pulmonary disease, emphysema -metabolic acidosis -on a ketogenic diet -schedule for surgery or a procedure -suicidal thoughts, plans, or attempt; a previous suicide  attempt by you or a family member -an unusual or allergic reaction to topiramate, other medicines, foods, dyes, or preservatives -pregnant or trying to get pregnant -breast-feeding How should I use this medicine? Take this medicine by mouth with a glass of water. Follow the directions on the prescription label. Do not crush or chew. You may take this medicine with meals. Take your medicine at regular intervals. Do not take it more often than directed. Talk to your pediatrician regarding the use of this medicine in children. Special care may be needed. While this drug may be prescribed for children as young as 59 years of age for selected conditions, precautions do apply. Overdosage: If you think you have taken too much of this medicine contact a poison control center or emergency room at once. NOTE: This medicine is only for you. Do not share this medicine with others. What if I miss a dose? If you miss a dose, take it as soon as you can. If your next dose is to be taken in less than 6 hours, then do not take the missed dose. Take the next dose at your regular time. Do not take double or extra doses. What may interact with this medicine? Do not take this medicine with any of the following medications: -probenecid This medicine may also interact with the following medications: -acetazolamide -alcohol -amitriptyline -aspirin and aspirin-like medicines -birth control pills -certain medicines for depression -certain medicines for seizures -certain medicines that treat or prevent blood clots like warfarin, enoxaparin, dalteparin, apixaban, dabigatran, and rivaroxaban -digoxin -hydrochlorothiazide -lithium -medicines for pain, sleep, or muscle relaxation -metformin -methazolamide -NSAIDS, medicines  for pain and inflammation, like ibuprofen or naproxen -pioglitazone -risperidone This list may not describe all possible interactions. Give your health care provider a list of all the medicines,  herbs, non-prescription drugs, or dietary supplements you use. Also tell them if you smoke, drink alcohol, or use illegal drugs. Some items may interact with your medicine. What should I watch for while using this medicine? Visit your doctor or health care professional for regular checks on your progress. Do not stop taking this medicine suddenly. This increases the risk of seizures if you are using this medicine to control epilepsy. Wear a medical identification bracelet or chain to say you have epilepsy or seizures, and carry a card that lists all your medicines. This medicine can decrease sweating and increase your body temperature. Watch for signs of deceased sweating or fever, especially in children. Avoid extreme heat, hot baths, and saunas. Be careful about exercising, especially in hot weather. Contact your health care provider right away if you notice a fever or decrease in sweating. You should drink plenty of fluids while taking this medicine. If you have had kidney stones in the past, this will help to reduce your chances of forming kidney stones. If you have stomach pain, with nausea or vomiting and yellowing of your eyes or skin, call your doctor immediately. You may get drowsy, dizzy, or have blurred vision. Do not drive, use machinery, or do anything that needs mental alertness until you know how this medicine affects you. To reduce dizziness, do not sit or stand up quickly, especially if you are an older patient. Alcohol can increase drowsiness and dizziness. Avoid alcoholic drinks. If you notice blurred vision, eye pain, or other eye problems, seek medical attention at once for an eye exam. The use of this medicine may increase the chance of suicidal thoughts or actions. Pay special attention to how you are responding while on this medicine. Any worsening of mood, or thoughts of suicide or dying should be reported to your health care professional right away. This medicine may increase the  chance of developing metabolic acidosis. If left untreated, this can cause kidney stones, bone disease, or slowed growth in children. Symptoms include breathing fast, fatigue, loss of appetite, irregular heartbeat, or loss of consciousness. Call your doctor immediately if you experience any of these side effects. Also, tell your doctor about any surgery you plan on having while taking this medicine since this may increase your risk for metabolic acidosis. Birth control pills may not work properly while you are taking this medicine. Talk to your doctor about using an extra method of birth control. Women who become pregnant while using this medicine may enroll in the [place] [employer] Antiepileptic Drug Pregnancy Registry by calling [phone]. This registry collects information about the safety of antiepileptic drug use during pregnancy. What side effects may I notice from receiving this medicine? Side effects that you should report to your doctor or health care professional as soon as possible: -allergic reactions like skin rash, itching or hives, swelling of the face, lips, or tongue -decreased sweating and/or rise in body temperature -depression -difficulty breathing, fast or irregular breathing patterns -difficulty speaking -difficulty walking or controlling muscle movements -hearing impairment -redness, blistering, peeling or loosening of the skin, including inside the mouth -tingling, pain or numbness in the hands or feet -unusual bleeding or bruising -unusually weak or tired -worsening of mood, thoughts or actions of suicide or dying Side effects that usually do not require medical attention (report to your  doctor or health care professional if they continue or are bothersome): -altered taste -back pain, joint or muscle aches and pains -diarrhea, or constipation -headache -loss of appetite -nausea -stomach upset, indigestion -tremors This list may not describe all possible side  effects. Call your doctor for medical advice about side effects. You may report side effects to FDA at 1-800-FDA-1088. Where should I keep my medicine? Keep out of the reach of children. Store at room temperature between 15 and 30 degrees C (59 and 86 degrees F) in a tightly closed container. Protect from moisture. Throw away any unused medicine after the expiration date. NOTE: This sheet is a summary. It may not cover all possible information. If you have questions about this medicine, talk to your doctor, pharmacist, or health care provider.  2018 Elsevier/Gold Standard (2013-11-28 23:17:57)  Ondansetron oral dissolving tablet What is this medicine? ONDANSETRON (on DAN se tron) is used to treat nausea and vomiting caused by chemotherapy. It is also used to prevent or treat nausea and vomiting after surgery. This medicine may be used for other purposes; ask your health care provider or pharmacist if you have questions. COMMON BRAND NAME(S): Zofran ODT What should I tell my health care provider before I take this medicine? They need to know if you have any of these conditions: -heart disease -history of irregular heartbeat -liver disease -low levels of magnesium or potassium in the blood -an unusual or allergic reaction to ondansetron, granisetron, other medicines, foods, dyes, or preservatives -pregnant or trying to get pregnant -breast-feeding How should I use this medicine? These tablets are made to dissolve in the mouth. Do not try to push the tablet through the foil backing. With dry hands, peel away the foil backing and gently remove the tablet. Place the tablet in the mouth and allow it to dissolve, then swallow. While you may take these tablets with water, it is not necessary to do so. Talk to your pediatrician regarding the use of this medicine in children. Special care may be needed. Overdosage: If you think you have taken too much of this medicine contact a poison control center  or emergency room at once. NOTE: This medicine is only for you. Do not share this medicine with others. What if I miss a dose? If you miss a dose, take it as soon as you can. If it is almost time for your next dose, take only that dose. Do not take double or extra doses. What may interact with this medicine? Do not take this medicine with any of the following medications: -apomorphine -certain medicines for fungal infections like fluconazole, itraconazole, ketoconazole, posaconazole, voriconazole -cisapride -dofetilide -dronedarone -pimozide -thioridazine -ziprasidone This medicine may also interact with the following medications: -carbamazepine -certain medicines for depression, anxiety, or psychotic disturbances -fentanyl -linezolid -MAOIs like Carbex, Eldepryl, Marplan, Nardil, and Parnate -methylene blue (injected into a vein) -other medicines that prolong the QT interval (cause an abnormal heart rhythm) -phenytoin -rifampicin -tramadol This list may not describe all possible interactions. Give your health care provider a list of all the medicines, herbs, non-prescription drugs, or dietary supplements you use. Also tell them if you smoke, drink alcohol, or use illegal drugs. Some items may interact with your medicine. What should I watch for while using this medicine? Check with your doctor or health care professional as soon as you can if you have any sign of an allergic reaction. What side effects may I notice from receiving this medicine? Side effects that you should report  to your doctor or health care professional as soon as possible: -allergic reactions like skin rash, itching or hives, swelling of the face, lips, or tongue -breathing problems -confusion -dizziness -fast or irregular heartbeat -feeling faint or lightheaded, falls -fever and chills -loss of balance or coordination -seizures -sweating -swelling of the hands and feet -tightness in the  chest -tremors -unusually weak or tired Side effects that usually do not require medical attention (report to your doctor or health care professional if they continue or are bothersome): -constipation or diarrhea -headache This list may not describe all possible side effects. Call your doctor for medical advice about side effects. You may report side effects to FDA at 1-800-FDA-1088. Where should I keep my medicine? Keep out of the reach of children. Store between 2 and 30 degrees C (36 and 86 degrees F). Throw away any unused medicine after the expiration date. NOTE: This sheet is a summary. It may not cover all possible information. If you have questions about this medicine, talk to your doctor, pharmacist, or health care provider.  2018 Elsevier/Gold Standard (2013-08-31 16:21:52)  Rizatriptan disintegrating tablets What is this medicine? RIZATRIPTAN (rye za TRIP tan) is used to treat migraines with or without aura. An aura is a strange feeling or visual disturbance that warns you of an attack. It is not used to prevent migraines. This medicine may be used for other purposes; ask your health care provider or pharmacist if you have questions. COMMON BRAND NAME(S): Maxalt-MLT What should I tell my health care provider before I take this medicine? They need to know if you have any of these conditions: -bowel disease or colitis -diabetes -family history of heart disease -fast or irregular heart beat -heart or blood vessel disease, angina (chest pain), or previous heart attack -high blood pressure -high cholesterol -history of stroke, transient ischemic attacks (TIAs or mini-strokes), or intracranial bleeding -kidney or liver disease -overweight -poor circulation -postmenopausal or surgical removal of uterus and ovaries -an unusual or allergic reaction to rizatriptan, other medicines, foods, dyes, or preservatives -pregnant or trying to get pregnant -breast-feeding How should I use  this medicine? Take this medicine by mouth. Follow the directions on the prescription label. This medicine is taken at the first symptoms of a migraine. It is not for everyday use. Leave the tablet in the foil package until you are ready to take it. Do not push the tablet through the blister pack. Peel open the blister pack with dry hands and place the tablet on your tongue. The tablet will dissolve rapidly and be swallowed in your saliva. It is not necessary to drink any water to take this medicine. If your migraine headache returns after one dose, you can take another dose as directed. You must leave at least 2 hours between doses, and do not take more than 30 mg total in 24 hours. If there is no improvement at all after the first dose, do not take a second dose without talking to your doctor or health care professional. Do not take your medicine more often than directed. Talk to your pediatrician regarding the use of this medicine in children. While this drug may be prescribed for children as young as 6 years for selected conditions, precautions do apply. Overdosage: If you think you have taken too much of this medicine contact a poison control center or emergency room at once. NOTE: This medicine is only for you. Do not share this medicine with others. What if I miss a dose? This  does not apply; this medicine is not for regular use. What may interact with this medicine? Do not take this medicine with any of the following medicines: -amphetamine, dextroamphetamine or cocaine -dihydroergotamine, ergotamine, ergoloid mesylates, methysergide, or ergot-type medication - do not take within 24 hours of taking rizatriptan -feverfew -MAOIs like Carbex, Eldepryl, Marplan, Nardil, and Parnate - do not take rizatriptan within 2 weeks of stopping MAOI therapy. -other migraine medicines like almotriptan, eletriptan, naratriptan, sumatriptan, zolmitriptan - do not take within 24 hours of taking  rizatriptan -tryptophan This medicine may also interact with the following medications: -medicines for mental depression, anxiety or mood problems -propranolol This list may not describe all possible interactions. Give your health care provider a list of all the medicines, herbs, non-prescription drugs, or dietary supplements you use. Also tell them if you smoke, drink alcohol, or use illegal drugs. Some items may interact with your medicine. What should I watch for while using this medicine? Only take this medicine for a migraine headache. Take it if you get warning symptoms or at the start of a migraine attack. It is not for regular use to prevent migraine attacks. You may get drowsy or dizzy. Do not drive, use machinery, or do anything that needs mental alertness until you know how this medicine affects you. To reduce dizzy or fainting spells, do not sit or stand up quickly, especially if you are an older patient. Alcohol can increase drowsiness, dizziness and flushing. Avoid alcoholic drinks. Smoking cigarettes may increase the risk of heart-related side effects from using this medicine. If you take migraine medicines for 10 or more days a month, your migraines may get worse. Keep a diary of headache days and medicine use. Contact your healthcare professional if your migraine attacks occur more frequently. What side effects may I notice from receiving this medicine? Side effects that you should report to your doctor or health care professional as soon as possible: -allergic reactions like skin rash, itching or hives, swelling of the face, lips, or tongue -fast, slow, or irregular heart beat -increased or decreased blood pressure -seizures -severe stomach pain and cramping, bloody diarrhea -signs and symptoms of a blood clot such as breathing problems; changes in vision; chest pain; severe, sudden headache; pain, swelling, warmth in the leg; trouble speaking; sudden numbness or weakness of the  face, arm or leg -tingling, pain, or numbness in the face, hands, or feet Side effects that usually do not require medical attention (report to your doctor or health care professional if they continue or are bothersome): -drowsiness -dry mouth -feeling warm, flushing, or redness of the face -headache -muscle cramps, pain -nausea, vomiting -unusually weak or tired This list may not describe all possible side effects. Call your doctor for medical advice about side effects. You may report side effects to FDA at 1-800-FDA-1088. Where should I keep my medicine? Keep out of the reach of children. Store at room temperature between 15 and 30 degrees C (59 and 86 degrees F). Protect from light and moisture. Throw away any unused medicine after the expiration date. NOTE: This sheet is a summary. It may not cover all possible information. If you have questions about this medicine, talk to your doctor, pharmacist, or health care provider.  2018 Elsevier/Gold Standard (2013-07-26 10:17:42)

## 2018-10-13 NOTE — Anesthesia Postprocedure Evaluation (Signed)
Anesthesia Post Note  Patient: Melissa Chandler  Procedure(s) Performed: DILATATION & CURETTAGE/HYSTEROSCOPY WITH MYOSURE POLYPECTOMY (N/A Vagina )     Patient location during evaluation: PACU Anesthesia Type: General Level of consciousness: awake and alert Pain management: pain level controlled Vital Signs Assessment: post-procedure vital signs reviewed and stable Respiratory status: spontaneous breathing, nonlabored ventilation, respiratory function stable and patient connected to nasal cannula oxygen Cardiovascular status: blood pressure returned to baseline and stable Postop Assessment: no apparent nausea or vomiting Anesthetic complications: no    Last Vitals:  Vitals:   10/12/18 1456 10/12/18 1538  BP:  (!) 142/87  Pulse: 85 83  Resp: 17 16  Temp:  36.7 C  SpO2: 100% 100%    Last Pain:  Vitals:   10/12/18 1538  TempSrc:   PainSc: 2                  Chelsey L Woodrum

## 2018-10-14 ENCOUNTER — Telehealth: Payer: Self-pay | Admitting: Neurology

## 2018-10-14 ENCOUNTER — Encounter (HOSPITAL_COMMUNITY): Payer: Self-pay | Admitting: Obstetrics and Gynecology

## 2018-10-14 NOTE — Telephone Encounter (Signed)
MR Brain w/wo contrast Dr. Lucia Gaskins [facility] Auth: NPR via [facility] website  [employer] Fed auth: [place] Ref # [id number]. Patient is scheduled at [facility] for 10/20/18.

## 2018-10-20 ENCOUNTER — Ambulatory Visit (INDEPENDENT_AMBULATORY_CARE_PROVIDER_SITE_OTHER): Payer: 59

## 2018-10-20 DIAGNOSIS — H539 Unspecified visual disturbance: Secondary | ICD-10-CM

## 2018-10-20 DIAGNOSIS — R51 Headache with orthostatic component, not elsewhere classified: Secondary | ICD-10-CM

## 2018-10-20 DIAGNOSIS — R519 Headache, unspecified: Secondary | ICD-10-CM

## 2018-10-20 DIAGNOSIS — H814 Vertigo of central origin: Secondary | ICD-10-CM | POA: Diagnosis not present

## 2018-10-20 DIAGNOSIS — H53459 Other localized visual field defect, unspecified eye: Secondary | ICD-10-CM | POA: Diagnosis not present

## 2018-10-20 MED ORDER — GADOBENATE DIMEGLUMINE 529 MG/ML IV SOLN
20.0000 mL | Freq: Once | INTRAVENOUS | Status: AC | PRN
  Administered 2018-10-20: 20 mL via INTRAVENOUS

## 2018-11-09 DIAGNOSIS — M47812 Spondylosis without myelopathy or radiculopathy, cervical region: Secondary | ICD-10-CM | POA: Diagnosis not present

## 2018-11-09 DIAGNOSIS — G894 Chronic pain syndrome: Secondary | ICD-10-CM | POA: Diagnosis not present

## 2018-11-09 DIAGNOSIS — Z79899 Other long term (current) drug therapy: Secondary | ICD-10-CM | POA: Diagnosis not present

## 2018-11-09 DIAGNOSIS — M1710 Unilateral primary osteoarthritis, unspecified knee: Secondary | ICD-10-CM | POA: Diagnosis not present

## 2018-11-09 DIAGNOSIS — Z79891 Long term (current) use of opiate analgesic: Secondary | ICD-10-CM | POA: Diagnosis not present

## 2018-12-29 ENCOUNTER — Ambulatory Visit: Payer: 59 | Admitting: Allergy

## 2019-01-04 DIAGNOSIS — M25579 Pain in unspecified ankle and joints of unspecified foot: Secondary | ICD-10-CM | POA: Diagnosis not present

## 2019-01-04 DIAGNOSIS — M79673 Pain in unspecified foot: Secondary | ICD-10-CM | POA: Diagnosis not present

## 2019-01-04 DIAGNOSIS — G894 Chronic pain syndrome: Secondary | ICD-10-CM | POA: Diagnosis not present

## 2019-01-04 DIAGNOSIS — Z79891 Long term (current) use of opiate analgesic: Secondary | ICD-10-CM | POA: Diagnosis not present

## 2019-01-04 DIAGNOSIS — Z79899 Other long term (current) drug therapy: Secondary | ICD-10-CM | POA: Diagnosis not present

## 2019-01-04 DIAGNOSIS — M47812 Spondylosis without myelopathy or radiculopathy, cervical region: Secondary | ICD-10-CM | POA: Diagnosis not present

## 2019-01-07 ENCOUNTER — Other Ambulatory Visit: Payer: Self-pay | Admitting: *Deleted

## 2019-01-07 ENCOUNTER — Telehealth: Payer: Self-pay | Admitting: Neurology

## 2019-01-07 ENCOUNTER — Other Ambulatory Visit: Payer: Self-pay | Admitting: Physician Assistant

## 2019-01-07 ENCOUNTER — Ambulatory Visit
Admission: RE | Admit: 2019-01-07 | Discharge: 2019-01-07 | Disposition: A | Discharge status: Home / Self Care | Payer: BLUE CROSS/BLUE SHIELD | Source: Ambulatory Visit | Attending: Physician Assistant | Admitting: Physician Assistant

## 2019-01-07 DIAGNOSIS — M7732 Calcaneal spur, left foot: Secondary | ICD-10-CM | POA: Diagnosis not present

## 2019-01-07 DIAGNOSIS — M79671 Pain in right foot: Secondary | ICD-10-CM

## 2019-01-07 DIAGNOSIS — M7731 Calcaneal spur, right foot: Secondary | ICD-10-CM | POA: Diagnosis not present

## 2019-01-07 DIAGNOSIS — M25572 Pain in left ankle and joints of left foot: Secondary | ICD-10-CM

## 2019-01-07 DIAGNOSIS — M25571 Pain in right ankle and joints of right foot: Secondary | ICD-10-CM

## 2019-01-07 DIAGNOSIS — M79672 Pain in left foot: Secondary | ICD-10-CM

## 2019-01-07 MED ORDER — TOPIRAMATE 50 MG PO TABS: 50.0000 mg | ORAL_TABLET | Freq: Every day | ORAL | 1 refills | Status: DC

## 2019-01-07 NOTE — Telephone Encounter (Signed)
I spoke with the pt. She is not having any s/e to Topiramate. She is open to increasing to 50 mg at bedtime. She is currently taking 25 mg at bedtime. I discussed with Dr. Lucia Gaskins and she authorized new prescription for dose increase.   Topiramate 50 mg [facility] #90 refills 1 sent to pharmacy.

## 2019-01-07 NOTE — Telephone Encounter (Signed)
Pt has called for a refill on her topiramate (TOPAMAX) 25 MG tablet [id number]  Insurance Name-[employer](pt's Primary) Member ZO#[id number] Group#[id number] Pt's secondary [employer] Member ZH#[id number] Group#N/[id number] RX PCN#[id number] RxGroup#[id number]

## 2019-01-27 DIAGNOSIS — E038 Other specified hypothyroidism: Secondary | ICD-10-CM | POA: Diagnosis not present

## 2019-01-27 DIAGNOSIS — I10 Essential (primary) hypertension: Secondary | ICD-10-CM | POA: Diagnosis not present

## 2019-01-27 DIAGNOSIS — E7800 Pure hypercholesterolemia, unspecified: Secondary | ICD-10-CM | POA: Diagnosis not present

## 2019-01-27 DIAGNOSIS — N3281 Overactive bladder: Secondary | ICD-10-CM | POA: Diagnosis not present

## 2019-02-02 ENCOUNTER — Ambulatory Visit (INDEPENDENT_AMBULATORY_CARE_PROVIDER_SITE_OTHER): Payer: BLUE CROSS/BLUE SHIELD | Admitting: Podiatry

## 2019-02-02 ENCOUNTER — Ambulatory Visit (INDEPENDENT_AMBULATORY_CARE_PROVIDER_SITE_OTHER): Payer: BLUE CROSS/BLUE SHIELD

## 2019-02-02 ENCOUNTER — Encounter: Payer: Self-pay | Admitting: Podiatry

## 2019-02-02 VITALS — BP 133/82 | HR 80 | Resp 14

## 2019-02-02 DIAGNOSIS — M7730 Calcaneal spur, unspecified foot: Secondary | ICD-10-CM | POA: Diagnosis not present

## 2019-02-02 DIAGNOSIS — M722 Plantar fascial fibromatosis: Secondary | ICD-10-CM | POA: Diagnosis not present

## 2019-02-02 DIAGNOSIS — M792 Neuralgia and neuritis, unspecified: Secondary | ICD-10-CM | POA: Diagnosis not present

## 2019-02-02 DIAGNOSIS — M79672 Pain in left foot: Principal | ICD-10-CM

## 2019-02-02 DIAGNOSIS — M79671 Pain in right foot: Secondary | ICD-10-CM

## 2019-02-02 MED ORDER — TRIAMCINOLONE ACETONIDE 10 MG/ML IJ SUSP
10.0000 mg | Freq: Once | INTRAMUSCULAR | Status: AC
  Administered 2019-02-02: 10 mg

## 2019-02-02 MED ORDER — METHYLPREDNISOLONE 4 MG PO TBPK: ORAL_TABLET | ORAL | 0 refills | Status: DC

## 2019-02-02 NOTE — Patient Instructions (Signed)

## 2019-02-02 NOTE — Progress Notes (Signed)
Subjective:   Patient ID: Melissa Chandler, female   DOB: 43 y.o.   MRN: [id number]   HPI 43 year old female presents the office today for concerns of bilateral heel pain which is been found about 6 months.  She states that she gets pain to the bottom of her heel.  She describes tender, sore pain at the bottom of her heels.  She states that is more consistent throughout the day but she also states that she sits or sometimes stands back up she is a lot of pain after being on her feet all day.  She occasionally gets some shooting, sharp pain as well.  She said no recent treatment for this.  She denies any recent injury or trauma.  She currently sees pain management due to her fibromyalgia and they have her on Lyrica, tramadol, meloxicam.   Review of Systems  All other systems reviewed and are negative.  Past Medical History:  Diagnosis Date  . Anxiety   . Cervical stenosis of spine   . Depression   . Eczema   . Fibromyalgia   . HTN (hypertension)   . Hyperlipidemia   . Hypothyroidism   . Insomnia   . Iron deficiency   . Nonallergic rhinitis   . Osteopenia   . Overactive bladder   . Seasonal allergies   . SVD (spontaneous vaginal delivery)    x 1  . Upper airway cough syndrome   . Vertigo   . Vitiligo     Past Surgical History:  Procedure Laterality Date  . CESAREAN SECTION  2002   x 1  . DILATATION & CURETTAGE/HYSTEROSCOPY WITH MYOSURE N/A 10/12/2018   Procedure: DILATATION & CURETTAGE/HYSTEROSCOPY WITH MYOSURE POLYPECTOMY;  Surgeon: Maxie Better, MD;  Location: [facility];  Service: Gynecology;  Laterality: N/A;  . DILATION AND CURETTAGE OF UTERUS  07/1996   mab  . HYSTEROSCOPY  10/12/2018   with D&C  . HYSTEROSCOPY  2014  . NASAL SEPTUM SURGERY  06/2011  . POLYPECTOMY  04/2012   D & C hysteroscopy for uterine polyps  . SINOSCOPY    . WISDOM TOOTH EXTRACTION       Current Outpatient Medications:  .  Cholecalciferol (VITAMIN D3 PO), Take 1 tablet by mouth daily.,  Disp: , Rfl:  .  ezetimibe (ZETIA) 10 MG tablet, Take 10 mg by mouth at bedtime. , Disp: , Rfl: 1 .  fexofenadine-pseudoephedrine (ALLEGRA-D) 60-120 MG 12 hr tablet, Take 1 tablet by mouth at bedtime., Disp: , Rfl:  .  Fluticasone Propionate (XHANCE) 93 MCG/ACT EXHU, Place 1 spray into the nose 2 (two) times daily., Disp: 16 mL, Rfl: 5 .  levothyroxine (SYNTHROID, LEVOTHROID) 25 MCG tablet, Take 37.5 mcg by mouth at bedtime. , Disp: , Rfl:  .  losartan-hydrochlorothiazide (HYZAAR) 100-12.5 MG tablet, Take 1 tablet by mouth at bedtime. , Disp: , Rfl:  .  meloxicam (MOBIC) 15 MG tablet, Take 15 mg by mouth at bedtime. , Disp: , Rfl:  .  methylPREDNISolone (MEDROL DOSEPAK) 4 MG TBPK tablet, Take as directed, Disp: 21 tablet, Rfl: 0 .  Omega 3-6-9 Fatty Acids (OMEGA-3-6-9 PO), Take 1 tablet by mouth at bedtime. , Disp: , Rfl:  .  ondansetron (ZOFRAN-ODT) 4 MG disintegrating tablet, Take 1 tablet (4 mg total) by mouth every 8 (eight) hours as needed for nausea., Disp: 30 tablet, Rfl: 3 .  pregabalin (LYRICA) 150 MG capsule, Take 150 mg by mouth 2 (two) times daily., Disp: , Rfl:  .  pyridoxine (B-6)  100 MG tablet, Take 100 mg by mouth at bedtime. , Disp: , Rfl:  .  rizatriptan (MAXALT-MLT) 10 MG disintegrating tablet, Take 1 tablet (10 mg total) by mouth as needed for migraine. May repeat in 2 hours if needed. Max 2x a day., Disp: 9 tablet, Rfl: 11 .  sertraline (ZOLOFT) 50 MG tablet, at bedtime. , Disp: , Rfl:  .  tolterodine (DETROL LA) 4 MG 24 hr capsule, Take 4 mg by mouth at bedtime. , Disp: , Rfl:  .  topiramate (TOPAMAX) 50 MG tablet, Take 1 tablet (50 mg total) by mouth at bedtime., Disp: 90 tablet, Rfl: 1 .  traMADol (ULTRAM) 50 MG tablet, Take 50 mg by mouth every 6 (six) hours as needed., Disp: , Rfl:  .  TURMERIC PO, Take 1 capsule by mouth at bedtime., Disp: , Rfl:  .  vitamin B-12 (CYANOCOBALAMIN) 250 MCG tablet, Take 250 mcg by mouth at bedtime. , Disp: , Rfl:   No Known  Allergies        Objective:  Physical Exam  General: AAO x3, NAD  Dermatological: Skin is warm, dry and supple bilateral. Nails x 10 are well manicured; remaining integument appears unremarkable at this time. There are no open sores, no preulcerative lesions, no rash or signs of infection present.  Vascular: Dorsalis Pedis artery and Posterior Tibial artery pedal pulses are 2/4 bilateral with immedate capillary fill time. Pedal hair growth present. No varicosities and no lower extremity edema present bilateral. There is no pain with calf compression, swelling, warmth, erythema.   Neruologic: Grossly intact via light touch bilateral. Vibratory intact via tuning fork bilateral. Protective threshold with Semmes Wienstein monofilament intact to all pedal sites bilateral. Negative Tinel sign.  Musculoskeletal: Tenderness to palpation along the plantar medial tubercle of the calcaneus at the insertion of plantar fascia on the left and right foot. There is no pain along the course of the plantar fascia within the arch of the foot. Plantar fascia appears to be intact. There is no pain with lateral compression of the calcaneus or pain with vibratory sensation. There is no pain along the course or insertion of the achilles tendon. No other areas of tenderness to bilateral lower extremities. Muscular strength 5/5 in all groups tested bilateral.  Equinus is present.  Gait: Unassisted, Nonantalgic.     Assessment:   43 year old female with bilateral heel pain plantar fasciitis; neuritis symptoms    Plan:  -Treatment options discussed including all alternatives, risks, and complications -Etiology of symptoms were discussed -I reviewed the x-rays that she had been previously.  Calcaneal spurring is present. -Steroid injection performed bilaterally.  See procedure note below. -Medrol Dosepak prescribed -Bilateral plantar fascial braces -Discussed stretching, ice exercises daily. -Discussed shoe gear  modifications and orthotics  Procedure: Injection Tendon/Ligament Discussed alternatives, risks, complications and verbal consent was obtained.  Location: Bilateral plantar fascia at the glabrous junction; medial approach. Skin Prep:  Alcohol Injectate: 0.5cc 0.5% marcaine plain, 0.5 cc 2% lidocaine plain and, 1 cc kenalog 10. Disposition: Patient tolerated procedure well. Injection site dressed with a band-aid.  Post-injection care was discussed and return precautions discussed.   RTC 3 weeks or sooner if needed  Vivi Barrack DPM

## 2019-02-02 NOTE — Progress Notes (Signed)
   Subjective:    Patient ID: Melissa Chandler, female    DOB: 07-Apr-1976, 43 y.o.   MRN: [id number]  HPI    Review of Systems  All other systems reviewed and are negative.      Objective:   Physical Exam        Assessment & Plan:

## 2019-02-03 ENCOUNTER — Other Ambulatory Visit: Payer: Self-pay | Admitting: Podiatry

## 2019-02-03 DIAGNOSIS — M722 Plantar fascial fibromatosis: Secondary | ICD-10-CM

## 2019-02-17 DIAGNOSIS — J029 Acute pharyngitis, unspecified: Secondary | ICD-10-CM | POA: Diagnosis not present

## 2019-03-01 DIAGNOSIS — M47812 Spondylosis without myelopathy or radiculopathy, cervical region: Secondary | ICD-10-CM | POA: Diagnosis not present

## 2019-03-01 DIAGNOSIS — M25579 Pain in unspecified ankle and joints of unspecified foot: Secondary | ICD-10-CM | POA: Diagnosis not present

## 2019-03-01 DIAGNOSIS — M79673 Pain in unspecified foot: Secondary | ICD-10-CM | POA: Diagnosis not present

## 2019-03-01 DIAGNOSIS — G894 Chronic pain syndrome: Secondary | ICD-10-CM | POA: Diagnosis not present

## 2019-03-03 ENCOUNTER — Other Ambulatory Visit: Payer: Self-pay

## 2019-03-03 ENCOUNTER — Ambulatory Visit (INDEPENDENT_AMBULATORY_CARE_PROVIDER_SITE_OTHER): Payer: BLUE CROSS/BLUE SHIELD | Admitting: Podiatry

## 2019-03-03 ENCOUNTER — Encounter: Payer: Self-pay | Admitting: Podiatry

## 2019-03-03 VITALS — Temp 99.5°F

## 2019-03-03 DIAGNOSIS — M722 Plantar fascial fibromatosis: Secondary | ICD-10-CM

## 2019-03-03 MED ORDER — TRIAMCINOLONE ACETONIDE 10 MG/ML IJ SUSP
10.0000 mg | Freq: Once | INTRAMUSCULAR | Status: AC
  Administered 2019-03-03: 10 mg

## 2019-03-03 NOTE — Progress Notes (Signed)
Subjective: 43 year old female presents the office for follow-up relation of bilateral heel pain the left side worse than the right.  She states that overall she is doing much better but her pain started to come back.  She has been stretching as well as applying heat to the areas that felt better than ice.  The injections did well as well as the oral steroids. No recent injury. Denies any systemic complaints such as fevers, chills, nausea, vomiting. No acute changes since last appointment, and no other complaints at this time.   Objective: AAO x3, NAD DP/PT pulses palpable bilaterally, CRT less than 3 seconds There is improved but continued tenderness to palpation along the plantar medial tubercle of the calcaneus at the insertion of plantar fascia on the left . right foot. There is no pain along the course of the plantar fascia within the arch of the foot. Plantar fascia appears to be intact. There is no pain with lateral compression of the calcaneus or pain with vibratory sensation. There is no pain along the course or insertion of the achilles tendon. No other areas of tenderness to bilateral lower extremities. No open lesions or pre-ulcerative lesions.  No pain with calf compression, swelling, warmth, erythema  Assessment: Bilateral heel pain, plantar fasciitis-improvement  Plan: -All treatment options discussed with the patient including all alternatives, risks, complications.  -Second set steroid injection performed today.  See procedure note below. -Plantar fascial tape is applied. -Continue stretching, icing exercises daily or ice, heat contrast. -Discussion of patient's and orthotics. -Patient encouraged to call the office with any questions, concerns, change in symptoms.   Procedure: Injection Tendon/Ligament Discussed alternatives, risks, complications and verbal consent was obtained.  Location:: bilateral plantar fascia at the glabrous junction; medial approach. Skin Prep: Alcohol   Injectate: 0.5cc 0.5% marcaine plain, 0.5 cc 2% lidocaine plain and, 1 cc kenalog 10. Disposition: Patient tolerated procedure well. Injection site dressed with a band-aid.  Post-injection care was discussed and return precautions discussed.   RTC 4 weeks or sooner if needed  Vivi Barrack DPM

## 2019-03-10 DIAGNOSIS — M1710 Unilateral primary osteoarthritis, unspecified knee: Secondary | ICD-10-CM | POA: Diagnosis not present

## 2019-03-31 ENCOUNTER — Other Ambulatory Visit: Payer: Self-pay

## 2019-03-31 ENCOUNTER — Ambulatory Visit (INDEPENDENT_AMBULATORY_CARE_PROVIDER_SITE_OTHER): Payer: BLUE CROSS/BLUE SHIELD | Admitting: Podiatry

## 2019-03-31 ENCOUNTER — Encounter: Payer: Self-pay | Admitting: Podiatry

## 2019-03-31 VITALS — Temp 97.3°F

## 2019-03-31 DIAGNOSIS — M722 Plantar fascial fibromatosis: Secondary | ICD-10-CM

## 2019-03-31 MED ORDER — TRIAMCINOLONE ACETONIDE 10 MG/ML IJ SUSP
10.0000 mg | Freq: Once | INTRAMUSCULAR | Status: AC
  Administered 2019-03-31: 10 mg

## 2019-04-06 DIAGNOSIS — M722 Plantar fascial fibromatosis: Secondary | ICD-10-CM | POA: Insufficient documentation

## 2019-04-06 NOTE — Progress Notes (Signed)
Subjective: 43 year old female presents the office today for concerns and follow-up evaluation of bilateral heel pain.  Overall she is today doing much better though she still gets discomfort.  Braces help as well as the injections.  Also plantar fascial taping was helpful.  No recent injury or changes since I last saw her. Denies any systemic complaints such as fevers, chills, nausea, vomiting. No acute changes since last appointment, and no other complaints at this time.   Objective: AAO x3, NAD DP/PT pulses palpable bilaterally, CRT less than 3 seconds There is improved but continued tenderness palpation on the plantar medial tubercle of the calcaneus at the insertion of the peroneal fascia.  Plantar fascia appears to be intact.  No pain along the course or insertion of the Achilles tendon.  Thompson test is negative.  No edema, erythema.  Negative Tinel sign. No open lesions or pre-ulcerative lesions.  No pain with calf compression, swelling, warmth, erythema  Assessment: Improving bilateral heel pain, plantar fasciitis  Plan: -All treatment options discussed with the patient including all alternatives, risks, complications.  -Steroid injection performed today.  Discussed stretching, ice exercises daily.  Continue with shoe modifications as discussed orthotics. -Patient encouraged to call the office with any questions, concerns, change in symptoms.   Procedure: Injection Tendon/Ligament Discussed alternatives, risks, complications and verbal consent was obtained.  Location: Bilateral plantar fascia at the glabrous junction; medial approach. Skin Prep: Alcohol Injectate: 0.5cc 0.5% marcaine plain, 0.5 cc 2% lidocaine plain and, 1 cc kenalog 10. Disposition: Patient tolerated procedure well. Injection site dressed with a band-aid.  Post-injection care was discussed and return precautions discussed.   Vivi Barrack DPM

## 2019-04-07 ENCOUNTER — Telehealth: Payer: Self-pay | Admitting: Neurology

## 2019-04-07 ENCOUNTER — Encounter: Payer: Self-pay | Admitting: Neurology

## 2019-04-07 NOTE — Telephone Encounter (Addendum)
Due to current COVID 19 pandemic, our office is severely reducing in office visits for at least the next 2 weeks, in order to minimize the risk to our patients and healthcare providers. I offered DOXY.ME/ VV appt in May 2020 (6 month RV) with Shawnie Dapper, NP.  She consented.  Pt understands that although there may be some limitations with this type of visit, we will take all precautions to reduce any security or privacy concerns.  Pt understands that this will be treated like an in office visit and we will file with pt's insurance, and there may be a patient responsible charge related to this service. We will be using Doxy[employer] for the virtual video visit. No download needed. Will send email with instructions.  Pt verbalized understanding. Chart updated.

## 2019-04-07 NOTE — Telephone Encounter (Signed)
Patient sees Dr. Ahern. Patient was last seen Nov. 2019 and is due for a follow up in May 2020. Patient was on Megan NP's schedule for July, patient has never seen NP. I moved patient's appointment from Megan NP to Amy NP for same day in July, but patient can be called and offered a sooner appointment via virtual visit/telephone visit with Amy NP. °

## 2019-04-07 NOTE — Addendum Note (Signed)
Addended by: Guy Begin on: 04/07/2019 01:47 PM   Modules accepted: Orders

## 2019-04-13 ENCOUNTER — Telehealth: Payer: Self-pay | Admitting: Family Medicine

## 2019-04-13 NOTE — Telephone Encounter (Signed)
I called the patient 3 times to rs with no answer or returned call. I called today and left a VM letting her know the apt was canceled and asking her to call us back to rs at her earliest convenience. DW

## 2019-04-14 ENCOUNTER — Ambulatory Visit: Payer: Self-pay | Admitting: Family Medicine

## 2019-04-28 ENCOUNTER — Other Ambulatory Visit: Payer: Self-pay

## 2019-04-28 ENCOUNTER — Encounter: Payer: Self-pay | Admitting: Podiatry

## 2019-04-28 ENCOUNTER — Ambulatory Visit (INDEPENDENT_AMBULATORY_CARE_PROVIDER_SITE_OTHER): Payer: BLUE CROSS/BLUE SHIELD | Admitting: Podiatry

## 2019-04-28 VITALS — Temp 97.3°F

## 2019-04-28 DIAGNOSIS — M79673 Pain in unspecified foot: Secondary | ICD-10-CM | POA: Diagnosis not present

## 2019-04-28 DIAGNOSIS — M47812 Spondylosis without myelopathy or radiculopathy, cervical region: Secondary | ICD-10-CM | POA: Diagnosis not present

## 2019-04-28 DIAGNOSIS — M722 Plantar fascial fibromatosis: Secondary | ICD-10-CM | POA: Diagnosis not present

## 2019-04-28 DIAGNOSIS — Z79891 Long term (current) use of opiate analgesic: Secondary | ICD-10-CM | POA: Diagnosis not present

## 2019-04-28 DIAGNOSIS — Z79899 Other long term (current) drug therapy: Secondary | ICD-10-CM | POA: Diagnosis not present

## 2019-04-28 DIAGNOSIS — G894 Chronic pain syndrome: Secondary | ICD-10-CM | POA: Diagnosis not present

## 2019-04-28 DIAGNOSIS — M25579 Pain in unspecified ankle and joints of unspecified foot: Secondary | ICD-10-CM | POA: Diagnosis not present

## 2019-04-28 MED ORDER — METHYLPREDNISOLONE 4 MG PO TBPK: ORAL_TABLET | ORAL | 0 refills | Status: DC

## 2019-05-07 NOTE — Progress Notes (Signed)
Subjective: 43 year old female presents the office today for follow evaluation of heel pain.  States that overall she is doing better, she still gets some discomfort.  She states the plantar fascial taping is more helpful.  Injections of also been helpful.  She says overall they are better than what they were.  No recent injury or trauma. Denies any systemic complaints such as fevers, chills, nausea, vomiting. No acute changes since last appointment, and no other complaints at this time.   Objective: AAO x3, NAD DP/PT pulses palpable bilaterally, CRT less than 3 seconds There is mild tenderness palpation on plantar medial tubercle of the calcaneus at the insertion of the plantar fascia bilaterally.  Plantar fascial peers to be intact.  No pain with lateral compression of calcaneus.  No edema, erythema. No open lesions or pre-ulcerative lesions.  No pain with calf compression, swelling, warmth, erythema  Assessment: Bilateral plantar fasciitis with improvement but still with mild discomfort  Plan: -All treatment options discussed with the patient including all alternatives, risks, complications.  -Medrol Dosepak was prescribed today.  Hold off on anti-inflammatories. Plantar fascial tapings were applied.  Continue with stretching, ice exercises daily.  Discussed shoe modifications and orthotics again today.  Discussed other treatment options as well. -Patient encouraged to call the office with any questions, concerns, change in symptoms.   Vivi Barrack DPM

## 2019-05-19 ENCOUNTER — Ambulatory Visit: Payer: BC Managed Care – PPO | Admitting: Orthotics

## 2019-05-19 ENCOUNTER — Other Ambulatory Visit: Payer: Self-pay

## 2019-05-19 DIAGNOSIS — M722 Plantar fascial fibromatosis: Secondary | ICD-10-CM

## 2019-05-19 DIAGNOSIS — M7730 Calcaneal spur, unspecified foot: Secondary | ICD-10-CM

## 2019-05-19 NOTE — Progress Notes (Signed)
Patient came in today to pick up custom made foot orthotics.  The goals were accomplished and the patient reported no dissatisfaction with said orthotics.  Patient was advised of breakin period and how to report any issues. 

## 2019-05-26 ENCOUNTER — Ambulatory Visit (INDEPENDENT_AMBULATORY_CARE_PROVIDER_SITE_OTHER): Payer: BC Managed Care – PPO | Admitting: Podiatry

## 2019-05-26 ENCOUNTER — Other Ambulatory Visit: Payer: Self-pay

## 2019-05-26 ENCOUNTER — Encounter: Payer: Self-pay | Admitting: Podiatry

## 2019-05-26 VITALS — Temp 98.2°F

## 2019-05-26 DIAGNOSIS — M722 Plantar fascial fibromatosis: Secondary | ICD-10-CM

## 2019-06-01 NOTE — Progress Notes (Signed)
Subjective: 43 year old female presents the office today for follow evaluation of heel pain.  She states that overall she is doing much better.  She is not at 100% yet but overall much improved.  She said the taping was helpful. No recent injury. No swelling or redness. No numbness or tingling.   Objective: AAO x3, NAD DP/PT pulses palpable bilaterally, CRT less than 3 seconds There is minimal and much improved tenderness palpation on plantar medial tubercle of the calcaneus at the insertion of the plantar fascia bilaterally.  Plantar fascial peers to be intact.  No pain with lateral compression of calcaneus.  No edema, erythema. No open lesions or pre-ulcerative lesions.  No pain with calf compression, swelling, warmth, erythema  Assessment: Bilateral plantar fasciitis with improvement  Plan: -All treatment options discussed with the patient including all alternatives, risks, complications.  -Overall she states that she is doing much better not having any significant discomfort.  She was to hold off on another taping.  We encouraged to continue with stretching, ice exercises daily as well as supportive shoes and orthotics.  At this point we will see her back as needed encouraged to call any questions or concerns any changes.  Trula Slade DPM

## 2019-06-15 ENCOUNTER — Ambulatory Visit: Payer: 59 | Admitting: Adult Health

## 2019-06-15 ENCOUNTER — Ambulatory Visit: Payer: BLUE CROSS/BLUE SHIELD | Admitting: Family Medicine

## 2019-07-05 ENCOUNTER — Ambulatory Visit
Admission: RE | Admit: 2019-07-05 | Discharge: 2019-07-05 | Disposition: A | Discharge status: Home / Self Care | Payer: Federal, State, Local not specified - PPO | Source: Ambulatory Visit | Attending: Physician Assistant | Admitting: Physician Assistant

## 2019-07-05 ENCOUNTER — Other Ambulatory Visit: Payer: Self-pay | Admitting: Physician Assistant

## 2019-07-05 ENCOUNTER — Other Ambulatory Visit: Payer: Self-pay | Admitting: Neurology

## 2019-07-05 DIAGNOSIS — Z79899 Other long term (current) drug therapy: Secondary | ICD-10-CM | POA: Diagnosis not present

## 2019-07-05 DIAGNOSIS — M25561 Pain in right knee: Secondary | ICD-10-CM | POA: Diagnosis not present

## 2019-07-05 DIAGNOSIS — M1712 Unilateral primary osteoarthritis, left knee: Secondary | ICD-10-CM | POA: Diagnosis not present

## 2019-07-05 DIAGNOSIS — M25579 Pain in unspecified ankle and joints of unspecified foot: Secondary | ICD-10-CM | POA: Diagnosis not present

## 2019-07-05 DIAGNOSIS — M25562 Pain in left knee: Secondary | ICD-10-CM

## 2019-07-05 DIAGNOSIS — G894 Chronic pain syndrome: Secondary | ICD-10-CM | POA: Diagnosis not present

## 2019-07-05 DIAGNOSIS — Z79891 Long term (current) use of opiate analgesic: Secondary | ICD-10-CM | POA: Diagnosis not present

## 2019-07-05 DIAGNOSIS — M1710 Unilateral primary osteoarthritis, unspecified knee: Secondary | ICD-10-CM | POA: Diagnosis not present

## 2019-07-05 DIAGNOSIS — M47812 Spondylosis without myelopathy or radiculopathy, cervical region: Secondary | ICD-10-CM | POA: Diagnosis not present

## 2019-07-12 ENCOUNTER — Telehealth (INDEPENDENT_AMBULATORY_CARE_PROVIDER_SITE_OTHER): Payer: BC Managed Care – PPO | Admitting: Family Medicine

## 2019-07-12 ENCOUNTER — Encounter: Payer: Self-pay | Admitting: Family Medicine

## 2019-07-12 DIAGNOSIS — G43109 Migraine with aura, not intractable, without status migrainosus: Secondary | ICD-10-CM

## 2019-07-12 MED ORDER — RIZATRIPTAN BENZOATE 10 MG PO TBDP: 10.0000 mg | ORAL_TABLET | ORAL | 11 refills | Status: AC | PRN

## 2019-07-12 MED ORDER — TOPIRAMATE 50 MG PO TABS: 50.0000 mg | ORAL_TABLET | Freq: Every day | ORAL | 3 refills | Status: AC

## 2019-07-12 NOTE — Progress Notes (Signed)
PATIENT: Melissa Chandler DOB: [birth year]  REASON FOR VISIT: follow up HISTORY FROM: patient  Virtual Visit via Telephone Note  I connected with Melissa Chandler on 07/12/19 at  8:00 AM EDT by telephone and verified that I am speaking with the correct person using two identifiers.   I discussed the limitations, risks, security and privacy concerns of performing an evaluation and management service by telephone and the availability of in person appointments. I also discussed with the patient that there may be a patient responsible charge related to this service. The patient expressed understanding and agreed to proceed.   History of Present Illness:  07/12/19 Melissa Chandler is a 43 y.o. female here today for follow up for migraines. She continues topiramate 50mg  at night. She is having about 1-2 episodes of light sensitivity, sound sensitivity and nausea each month. She does not typically get a headache with symptoms. This has decreased from 2-3 per week. She does get relief with rizatriptan. She is tolerating medications well with no obvious adverse effects. She continues to see pain management for fibromyalgia and chronic pain. She is feeling well today and without complaints.    HISTORY: (copied from Melissa Chandler Melissa Chandler's note on 10/13/2018)  HPI:  Melissa Chandler is a 43 y.o. female here as requested by Melissa Chandler. Delena Chandler for migraine.  PMHx hypercholesterolemia, osteopenia, depression, hypertension, fibromyalgia, iron deficiency anemia, tinnitus of both ears, chronic constipation, hypothyroidism, restless legs. Symptoms started in her 6020s. She has light sensitivity, sound sensitivity, smells can make her nauseous and she has headaches more in the frontal area and behind the eyes and at the base of her skull. She has the symptoms up to 3x a week, lasts all day long, zofran helps. Sleep helps. The headaches can be pounding and throbbing and may be unilateral. Sometimes the symptoms preceded headache other times not.   She just started to have peripheral vision loss, black and fuzzy and then a severe headache and vomiting. Movement makes it worse. She wakes up with headaces and if she bends over the headache is worse. No FHx of migraines. She has episodes of vertigo. Son andmother has migraines.No other focal neurologic deficits, associated symptoms, inciting events or modifiable factors.  meds tried: Lyrica, zoloft, topamax  Reviewed notes, labs and imaging from outside physicians, which showed:  Reviewed notes from referring physician Jarrett Sohoourtney Wharton.  Patient [facility] [place] in also seen by Melissa Chandler. Haroldine Lawsrossley for the evaluation of environmental allergies and sinusitis.  These can trigger her headaches.  Headaches are on and off, triggers include sound light and certain scents.  She said ringing in her ears for years.  She saw Melissa Chandler. Haroldine Lawsrossley for this.  She also has some vertigo and being off balance.  She has allergy issues.  She is had sinus surgery and septal surgery back in [place].  Examination at Melissa Chandler. Haroldine Lawsrossley showed that the ears are perfectly clear but audiogram shows a mild conductive loss of the right ear in the lower frequency she does complain of tinnitus and mild vertigo.  Melissa Chandler. Haroldine Lawsrossley as well as her PCP and the allergy specialist suggested she may have migraines.  Per her PCP, headaches feel dull aching occurring over the frontal sinus and base of the skull she does note light sensitivity, phono and photophobia with nausea sometimes she has a fuzzy TV effect of her eyes and her peripheral vision and then has a bad headache.  Usually makes her sick to her stomach.  Taking Zofran which helps.  BMP normal   Observations/Objective:  Generalized: Well developed, in no acute distress  Mentation: Alert oriented to time, place, history taking. Follows all commands speech and language fluent   Assessment and Plan:  43 y.o. year old female  has a past medical history  of Anxiety, Cervical stenosis of spine, Depression, Eczema, Fibromyalgia, HTN (hypertension), Hyperlipidemia, Hypothyroidism, Insomnia, Iron deficiency, Nonallergic rhinitis, Osteopenia, Overactive bladder, Seasonal allergies, SVD (spontaneous vaginal delivery), Upper airway cough syndrome, Vertigo, and Vitiligo. here with    ICD-10-CM   1. Migraine with aura and without status migrainosus, not intractable  G43.[place] is doing very well on topiramate 50 mg nightly.  She is tolerating with no adverse effects.  We will continue current therapy.  We will also continue rizatriptan as needed for abortive therapy.  She was encouraged to reach out for any worsening of symptoms.  We will follow-up annually, sooner if needed.  She verbalizes understanding and agreement with this plan.  No orders of the defined types were placed in this encounter.   No orders of the defined types were placed in this encounter.    Follow Up Instructions:  I discussed the assessment and treatment plan with the patient. The patient was provided an opportunity to ask questions and all were answered. The patient agreed with the plan and demonstrated an understanding of the instructions.   The patient was advised to call back or seek an in-person evaluation if the symptoms worsen or if the condition fails to improve as anticipated.  I provided 15 minutes of non-face-to-face time during this encounter.  Patient is located at her place of residence during video conference.  Provider is located in the office.   Debbora Presto, NP

## 2019-07-13 NOTE — Progress Notes (Signed)
Made any corrections needed, and agree with history, physical, neuro exam,assessment and plan as stated.     Johnmark Geiger, MD [facility]  

## 2019-09-05 DIAGNOSIS — M170 Bilateral primary osteoarthritis of knee: Secondary | ICD-10-CM | POA: Diagnosis not present

## 2019-09-05 DIAGNOSIS — M797 Fibromyalgia: Secondary | ICD-10-CM | POA: Diagnosis not present

## 2019-09-05 DIAGNOSIS — M25561 Pain in right knee: Secondary | ICD-10-CM | POA: Diagnosis not present

## 2019-09-05 DIAGNOSIS — G894 Chronic pain syndrome: Secondary | ICD-10-CM | POA: Diagnosis not present

## 2019-09-14 DIAGNOSIS — M170 Bilateral primary osteoarthritis of knee: Secondary | ICD-10-CM | POA: Diagnosis not present

## 2019-09-20 DIAGNOSIS — Z23 Encounter for immunization: Secondary | ICD-10-CM | POA: Diagnosis not present

## 2019-09-20 DIAGNOSIS — E7800 Pure hypercholesterolemia, unspecified: Secondary | ICD-10-CM | POA: Diagnosis not present

## 2019-09-20 DIAGNOSIS — E038 Other specified hypothyroidism: Secondary | ICD-10-CM | POA: Diagnosis not present

## 2019-09-20 DIAGNOSIS — I10 Essential (primary) hypertension: Secondary | ICD-10-CM | POA: Diagnosis not present

## 2019-09-20 DIAGNOSIS — N3281 Overactive bladder: Secondary | ICD-10-CM | POA: Diagnosis not present

## 2019-09-20 DIAGNOSIS — R399 Unspecified symptoms and signs involving the genitourinary system: Secondary | ICD-10-CM | POA: Diagnosis not present

## 2019-09-28 DIAGNOSIS — M170 Bilateral primary osteoarthritis of knee: Secondary | ICD-10-CM | POA: Diagnosis not present

## 2019-10-27 DIAGNOSIS — G894 Chronic pain syndrome: Secondary | ICD-10-CM | POA: Diagnosis not present

## 2019-10-27 DIAGNOSIS — Z79899 Other long term (current) drug therapy: Secondary | ICD-10-CM | POA: Diagnosis not present

## 2019-10-27 DIAGNOSIS — M1710 Unilateral primary osteoarthritis, unspecified knee: Secondary | ICD-10-CM | POA: Diagnosis not present

## 2019-10-27 DIAGNOSIS — M47812 Spondylosis without myelopathy or radiculopathy, cervical region: Secondary | ICD-10-CM | POA: Diagnosis not present

## 2019-10-27 DIAGNOSIS — Z79891 Long term (current) use of opiate analgesic: Secondary | ICD-10-CM | POA: Diagnosis not present

## 2019-10-27 DIAGNOSIS — M79673 Pain in unspecified foot: Secondary | ICD-10-CM | POA: Diagnosis not present

## 2019-12-22 DIAGNOSIS — M25579 Pain in unspecified ankle and joints of unspecified foot: Secondary | ICD-10-CM | POA: Diagnosis not present

## 2019-12-22 DIAGNOSIS — Z79899 Other long term (current) drug therapy: Secondary | ICD-10-CM | POA: Diagnosis not present

## 2019-12-22 DIAGNOSIS — G894 Chronic pain syndrome: Secondary | ICD-10-CM | POA: Diagnosis not present

## 2019-12-22 DIAGNOSIS — M1710 Unilateral primary osteoarthritis, unspecified knee: Secondary | ICD-10-CM | POA: Diagnosis not present

## 2019-12-22 DIAGNOSIS — M47812 Spondylosis without myelopathy or radiculopathy, cervical region: Secondary | ICD-10-CM | POA: Diagnosis not present

## 2019-12-22 DIAGNOSIS — Z79891 Long term (current) use of opiate analgesic: Secondary | ICD-10-CM | POA: Diagnosis not present

## 2020-01-09 DIAGNOSIS — M79642 Pain in left hand: Secondary | ICD-10-CM | POA: Diagnosis not present

## 2020-01-09 DIAGNOSIS — M130 Polyarthritis, unspecified: Secondary | ICD-10-CM | POA: Diagnosis not present

## 2020-01-09 DIAGNOSIS — M79641 Pain in right hand: Secondary | ICD-10-CM | POA: Diagnosis not present

## 2020-01-09 DIAGNOSIS — M79671 Pain in right foot: Secondary | ICD-10-CM | POA: Diagnosis not present

## 2020-01-09 DIAGNOSIS — M25572 Pain in left ankle and joints of left foot: Secondary | ICD-10-CM | POA: Diagnosis not present

## 2020-01-09 DIAGNOSIS — M064 Inflammatory polyarthropathy: Secondary | ICD-10-CM | POA: Diagnosis not present

## 2020-01-09 DIAGNOSIS — M1990 Unspecified osteoarthritis, unspecified site: Secondary | ICD-10-CM | POA: Diagnosis not present

## 2020-01-09 DIAGNOSIS — D8989 Other specified disorders involving the immune mechanism, not elsewhere classified: Secondary | ICD-10-CM | POA: Diagnosis not present

## 2020-01-09 DIAGNOSIS — M79672 Pain in left foot: Secondary | ICD-10-CM | POA: Diagnosis not present

## 2020-01-09 DIAGNOSIS — M25571 Pain in right ankle and joints of right foot: Secondary | ICD-10-CM | POA: Diagnosis not present

## 2020-01-09 DIAGNOSIS — M25552 Pain in left hip: Secondary | ICD-10-CM | POA: Diagnosis not present

## 2020-01-09 DIAGNOSIS — M459 Ankylosing spondylitis of unspecified sites in spine: Secondary | ICD-10-CM | POA: Diagnosis not present

## 2020-01-31 DIAGNOSIS — M797 Fibromyalgia: Secondary | ICD-10-CM | POA: Diagnosis not present

## 2020-01-31 DIAGNOSIS — M2550 Pain in unspecified joint: Secondary | ICD-10-CM | POA: Diagnosis not present

## 2020-01-31 DIAGNOSIS — M549 Dorsalgia, unspecified: Secondary | ICD-10-CM | POA: Diagnosis not present

## 2020-01-31 DIAGNOSIS — M1990 Unspecified osteoarthritis, unspecified site: Secondary | ICD-10-CM | POA: Diagnosis not present

## 2020-02-21 DIAGNOSIS — M47812 Spondylosis without myelopathy or radiculopathy, cervical region: Secondary | ICD-10-CM | POA: Diagnosis not present

## 2020-02-21 DIAGNOSIS — G894 Chronic pain syndrome: Secondary | ICD-10-CM | POA: Diagnosis not present

## 2020-02-21 DIAGNOSIS — M797 Fibromyalgia: Secondary | ICD-10-CM | POA: Diagnosis not present

## 2020-02-21 DIAGNOSIS — M1710 Unilateral primary osteoarthritis, unspecified knee: Secondary | ICD-10-CM | POA: Diagnosis not present

## 2020-02-26 IMAGING — CR DG FOOT COMPLETE 3+V*R*
3 series · 3 of 3 positions shown · non-contrast
Comparison: None.

CLINICAL DATA: Bilateral foot and ankle pain.  No known injury.

EXAM:
RIGHT FOOT COMPLETE - 3+ VIEW

[t foot ap right (1 of 2)]
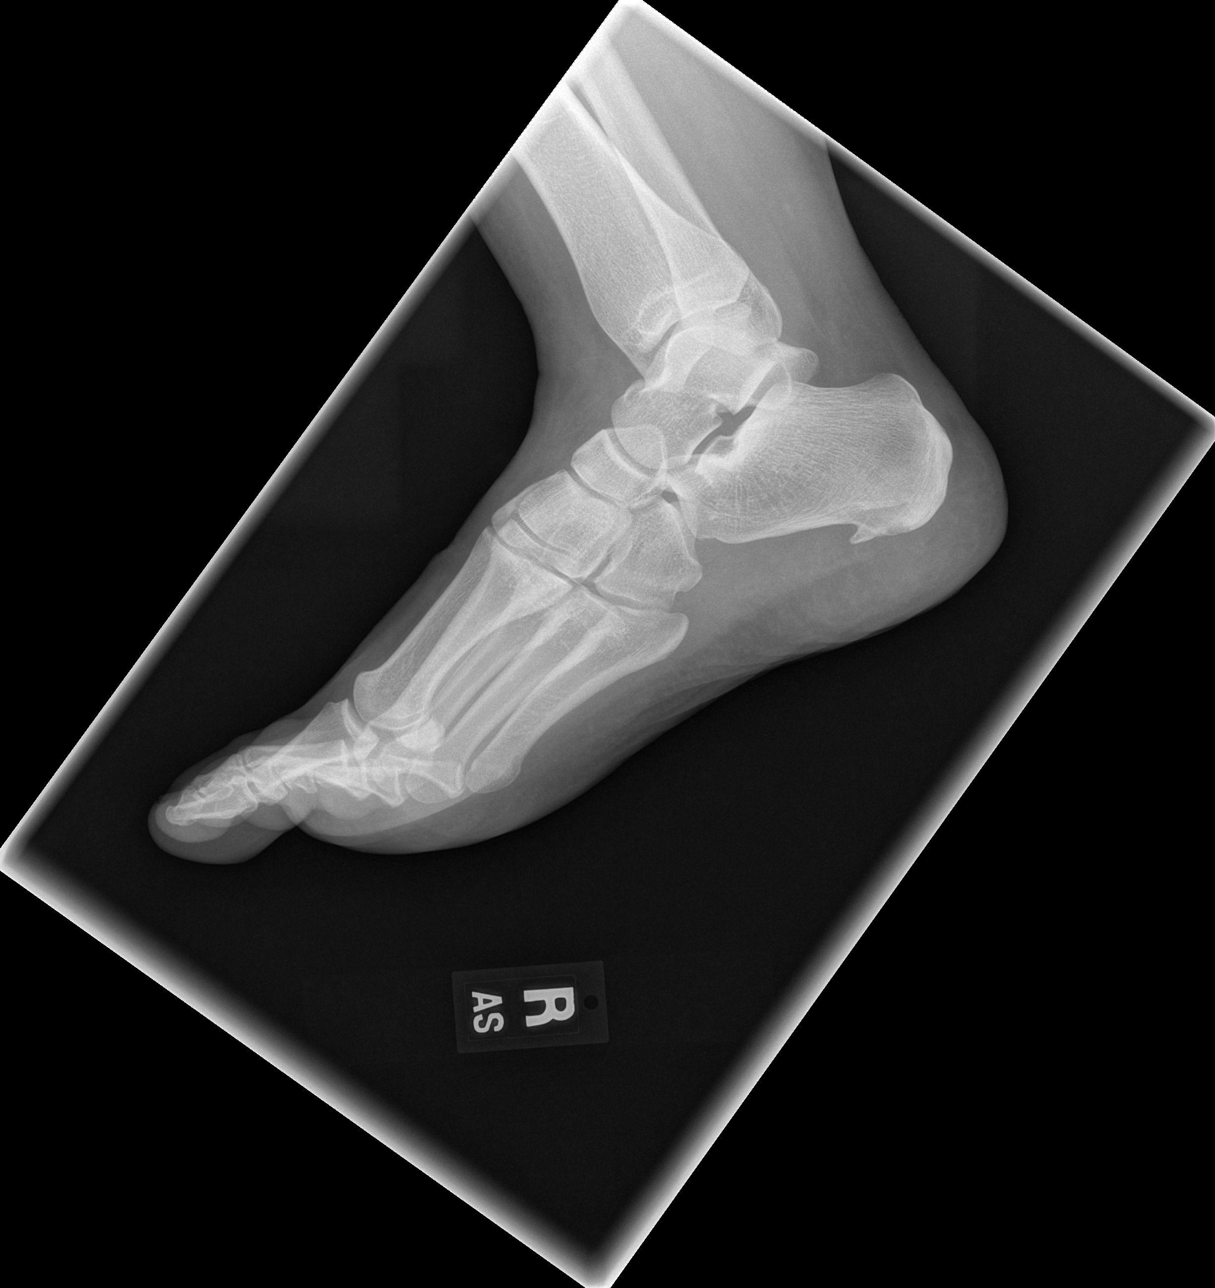

[t foot ap right (2 of 2)]
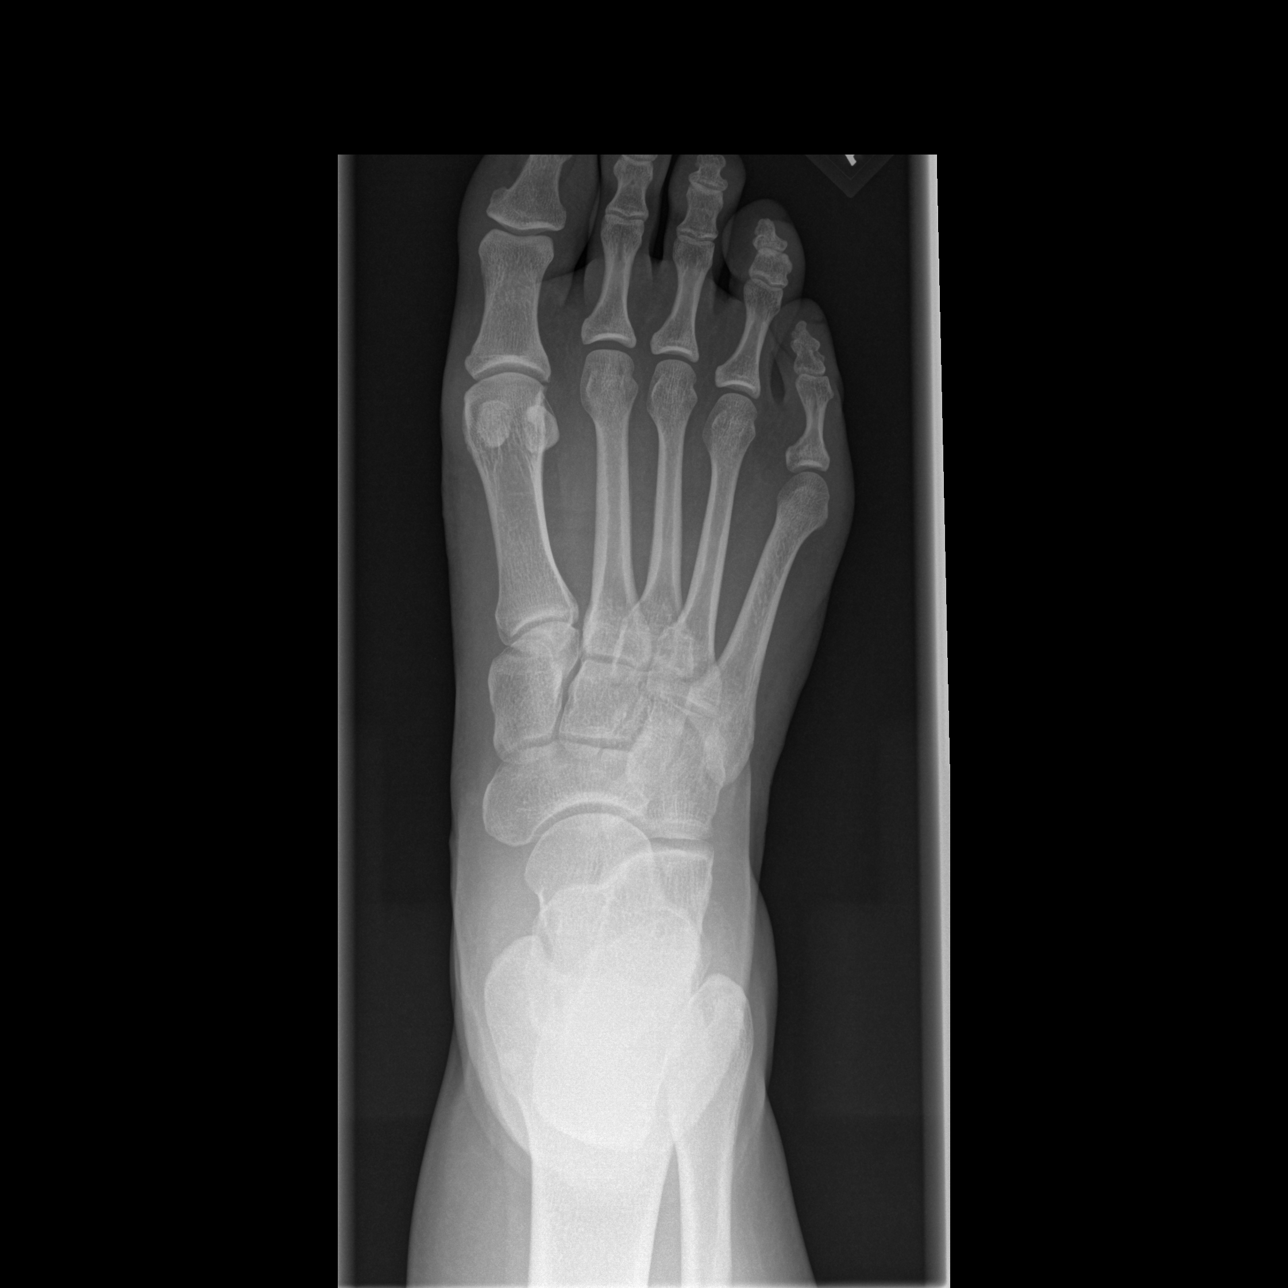

[t foot oblique right]
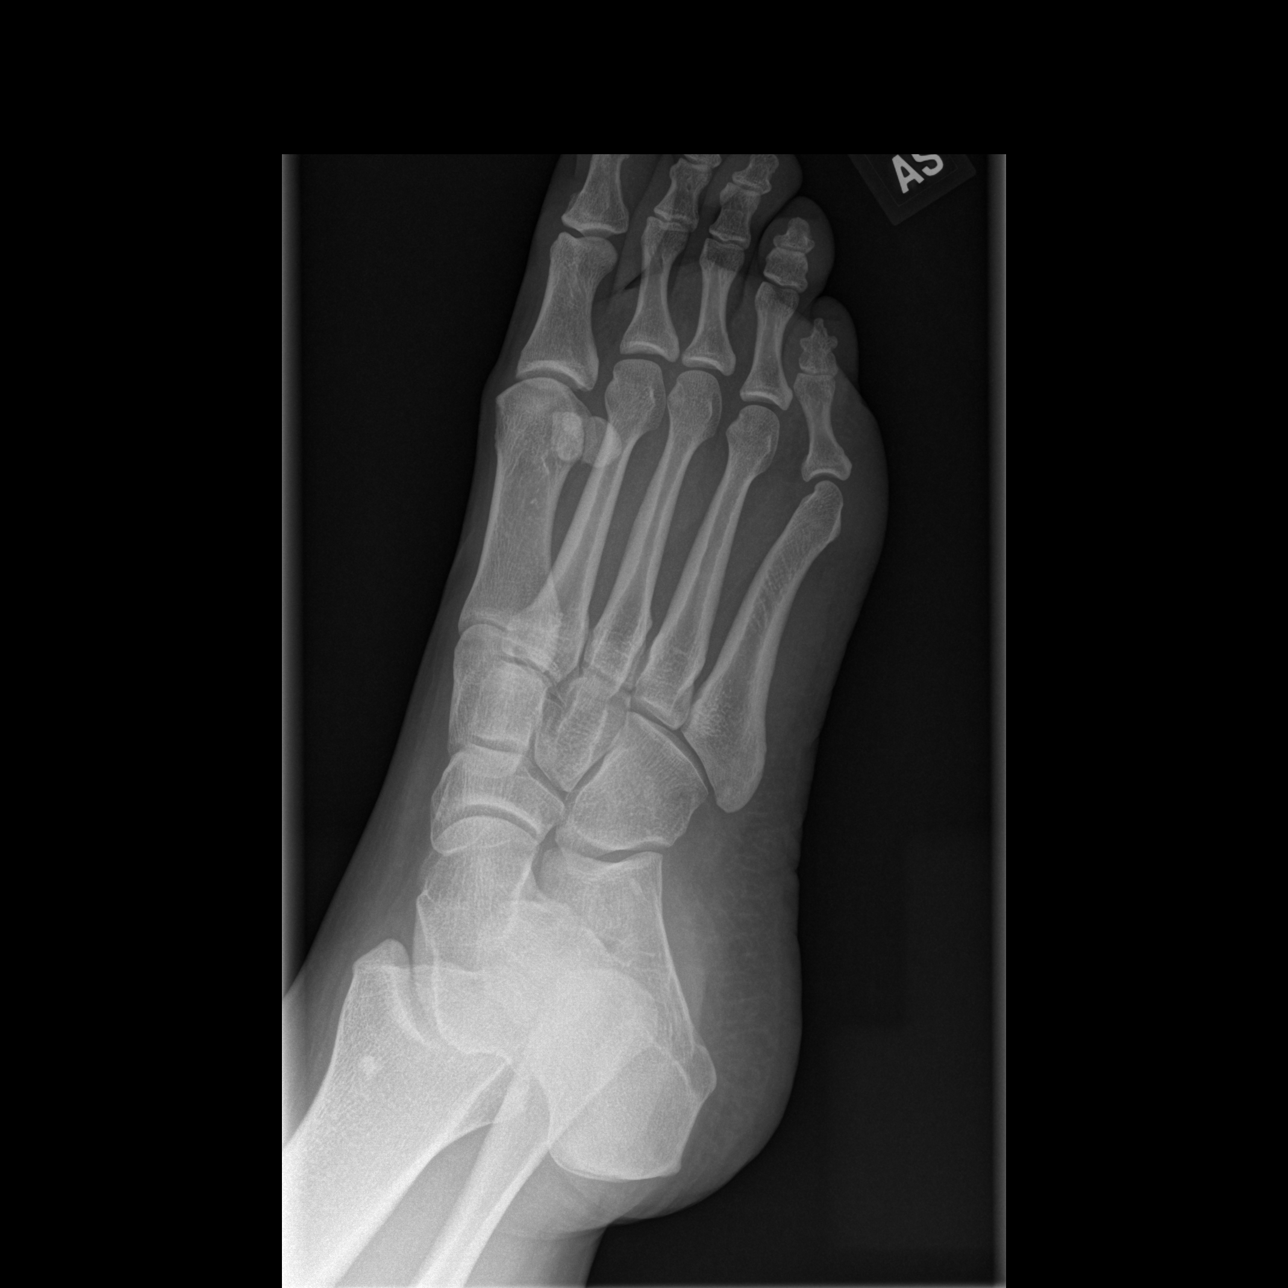

[3 of 3 positions shown; findings below may reference images not displayed]

FINDINGS: There is no evidence of fracture or dislocation. There is no
evidence of arthropathy or other focal bone abnormality. Soft
tissues are unremarkable. Small calcaneal spur.
IMPRESSION: Calcaneal spur.  No acute bony abnormality.

## 2020-03-21 DIAGNOSIS — I10 Essential (primary) hypertension: Secondary | ICD-10-CM | POA: Diagnosis not present

## 2020-03-21 DIAGNOSIS — N3281 Overactive bladder: Secondary | ICD-10-CM | POA: Diagnosis not present

## 2020-03-21 DIAGNOSIS — E7800 Pure hypercholesterolemia, unspecified: Secondary | ICD-10-CM | POA: Diagnosis not present

## 2020-03-21 DIAGNOSIS — Z0000 Encounter for general adult medical examination without abnormal findings: Secondary | ICD-10-CM | POA: Diagnosis not present

## 2020-03-21 DIAGNOSIS — E038 Other specified hypothyroidism: Secondary | ICD-10-CM | POA: Diagnosis not present

## 2020-04-17 DIAGNOSIS — Z79891 Long term (current) use of opiate analgesic: Secondary | ICD-10-CM | POA: Diagnosis not present

## 2020-04-17 DIAGNOSIS — M797 Fibromyalgia: Secondary | ICD-10-CM | POA: Diagnosis not present

## 2020-04-17 DIAGNOSIS — Z79899 Other long term (current) drug therapy: Secondary | ICD-10-CM | POA: Diagnosis not present

## 2020-04-17 DIAGNOSIS — G894 Chronic pain syndrome: Secondary | ICD-10-CM | POA: Diagnosis not present

## 2020-04-17 DIAGNOSIS — M25559 Pain in unspecified hip: Secondary | ICD-10-CM | POA: Diagnosis not present

## 2020-04-17 DIAGNOSIS — M1710 Unilateral primary osteoarthritis, unspecified knee: Secondary | ICD-10-CM | POA: Diagnosis not present

## 2020-04-22 DIAGNOSIS — R21 Rash and other nonspecific skin eruption: Secondary | ICD-10-CM | POA: Diagnosis not present

## 2020-06-12 DIAGNOSIS — M797 Fibromyalgia: Secondary | ICD-10-CM | POA: Diagnosis not present

## 2020-06-12 DIAGNOSIS — M1710 Unilateral primary osteoarthritis, unspecified knee: Secondary | ICD-10-CM | POA: Diagnosis not present

## 2020-06-12 DIAGNOSIS — M79673 Pain in unspecified foot: Secondary | ICD-10-CM | POA: Diagnosis not present

## 2020-06-12 DIAGNOSIS — G894 Chronic pain syndrome: Secondary | ICD-10-CM | POA: Diagnosis not present

## 2020-09-06 DIAGNOSIS — M1710 Unilateral primary osteoarthritis, unspecified knee: Secondary | ICD-10-CM | POA: Diagnosis not present

## 2020-09-06 DIAGNOSIS — Z79899 Other long term (current) drug therapy: Secondary | ICD-10-CM | POA: Diagnosis not present

## 2020-09-06 DIAGNOSIS — M797 Fibromyalgia: Secondary | ICD-10-CM | POA: Diagnosis not present

## 2020-09-06 DIAGNOSIS — M25579 Pain in unspecified ankle and joints of unspecified foot: Secondary | ICD-10-CM | POA: Diagnosis not present

## 2020-09-06 DIAGNOSIS — G894 Chronic pain syndrome: Secondary | ICD-10-CM | POA: Diagnosis not present

## 2020-09-06 DIAGNOSIS — Z79891 Long term (current) use of opiate analgesic: Secondary | ICD-10-CM | POA: Diagnosis not present

## 2020-09-25 DIAGNOSIS — Z1231 Encounter for screening mammogram for malignant neoplasm of breast: Secondary | ICD-10-CM | POA: Diagnosis not present

## 2020-10-15 DIAGNOSIS — I10 Essential (primary) hypertension: Secondary | ICD-10-CM | POA: Diagnosis not present

## 2020-10-15 DIAGNOSIS — Z23 Encounter for immunization: Secondary | ICD-10-CM | POA: Diagnosis not present

## 2020-10-15 DIAGNOSIS — E038 Other specified hypothyroidism: Secondary | ICD-10-CM | POA: Diagnosis not present

## 2020-10-15 DIAGNOSIS — N3281 Overactive bladder: Secondary | ICD-10-CM | POA: Diagnosis not present

## 2020-10-15 DIAGNOSIS — E7800 Pure hypercholesterolemia, unspecified: Secondary | ICD-10-CM | POA: Diagnosis not present

## 2020-10-24 ENCOUNTER — Encounter: Payer: Self-pay | Admitting: Pulmonary Disease

## 2020-10-24 ENCOUNTER — Other Ambulatory Visit: Payer: Self-pay

## 2020-10-24 ENCOUNTER — Ambulatory Visit (INDEPENDENT_AMBULATORY_CARE_PROVIDER_SITE_OTHER): Payer: BC Managed Care – PPO

## 2020-10-24 ENCOUNTER — Ambulatory Visit (INDEPENDENT_AMBULATORY_CARE_PROVIDER_SITE_OTHER): Payer: BC Managed Care – PPO | Admitting: Pulmonary Disease

## 2020-10-24 VITALS — BP 122/78 | HR 78 | Temp 98.1°F | Ht 69.0 in | Wt 210.0 lb

## 2020-10-24 DIAGNOSIS — R059 Cough, unspecified: Secondary | ICD-10-CM

## 2020-10-24 MED ORDER — FLOVENT HFA 220 MCG/ACT IN AERO: 2.0000 | INHALATION_SPRAY | Freq: Two times a day (BID) | RESPIRATORY_TRACT | 6 refills | Status: DC

## 2020-10-24 MED ORDER — ALBUTEROL SULFATE HFA 108 (90 BASE) MCG/ACT IN AERS: 2.0000 | INHALATION_SPRAY | Freq: Four times a day (QID) | RESPIRATORY_TRACT | 6 refills | Status: AC | PRN

## 2020-10-24 NOTE — Progress Notes (Signed)
Melissa Chandler    [id number]    24-Jan-1976  Primary Care Physician:Wharton, Rulon Eisenmenger  Referring Physician: Jarrett Soho, PA-C [address] [place],  [place] [address]  Chief complaint:   Patient being seen for chronic cough, wheezing  HPI:  Patient with history of allergies, shortness of breath, cough, wheezing She wheezes daily, she gets more short of breath daily  She has been using albuterol as needed which seems to help the shortness of breath and wheezing  No diagnosed history of asthma but does have a son with asthma No family history of asthma  Evaluated about 4 years ago for chronic cough felt to be related to upper airway cough  Denies any pertinent occupational history No hobbies that places her lungs at risk Never smoker Was never really exposed to secondhand smoke  She is allergic to yeast, there is a seasonal component to her allergies  She does have a history of fibromyalgia, hypothyroidism, history of depression/anxiety  She had a sleep study about 10 years ago, diagnosed with insomnia -Admits to occasional dryness of her mouth in the mornings No morning headaches Memory is not bad About 20 to 30 pound weight gain She has been told that she snores  She is compliant with nasal steroids and Allegra for allergies  Outpatient Encounter Medications as of 10/24/2020  Medication Sig  . Cholecalciferol (VITAMIN D3 PO) Take 1 tablet by mouth daily.  Marland Kitchen ezetimibe (ZETIA) 10 MG tablet Take 10 mg by mouth at bedtime.   . fexofenadine-pseudoephedrine (ALLEGRA-D) 60-120 MG 12 hr tablet Take 1 tablet by mouth daily as needed.   . Fluticasone Propionate (XHANCE) 93 MCG/ACT EXHU Place 1 spray into the nose 2 (two) times daily.  Marland Kitchen levothyroxine (SYNTHROID) 75 MCG tablet Take 75 mcg by mouth daily.  Marland Kitchen losartan-hydrochlorothiazide (HYZAAR) 100-12.5 MG tablet Take 1 tablet by mouth at bedtime.   Ailene Ards 3-6-9 Fatty Acids (OMEGA-3-6-9 PO) Take 1  tablet by mouth at bedtime.   . ondansetron (ZOFRAN-ODT) 4 MG disintegrating tablet Take 1 tablet (4 mg total) by mouth every 8 (eight) hours as needed for nausea.  Marland Kitchen oxybutynin (DITROPAN-XL) 10 MG 24 hr tablet Take 10 mg by mouth at bedtime.   . pregabalin (LYRICA) 150 MG capsule Take 150 mg by mouth 2 (two) times daily.  Marland Kitchen pyridoxine (B-6) 100 MG tablet Take 100 mg by mouth at bedtime.   . rizatriptan (MAXALT-MLT) 10 MG disintegrating tablet Take 1 tablet (10 mg total) by mouth as needed for migraine. May repeat in 2 hours if needed. Max 2x a day.  . sertraline (ZOLOFT) 50 MG tablet at bedtime.   . topiramate (TOPAMAX) 50 MG tablet Take 1 tablet (50 mg total) by mouth at bedtime.  . traMADol (ULTRAM) 50 MG tablet Take 50 mg by mouth every 6 (six) hours as needed.  . TURMERIC PO Take 1 capsule by mouth at bedtime.  . vitamin B-12 (CYANOCOBALAMIN) 250 MCG tablet Take 250 mcg by mouth at bedtime.   . [DISCONTINUED] meloxicam (MOBIC) 15 MG tablet Take 15 mg by mouth at bedtime.   . [DISCONTINUED] methylPREDNISolone (MEDROL DOSEPAK) 4 MG TBPK tablet Take as directed   No facility-administered encounter medications on file as of 10/24/2020.    Allergies as of 10/24/2020  . (No Known Allergies)    Past Medical History:  Diagnosis Date  . Anxiety   . Cervical stenosis of spine   . Depression   .  Eczema   . Fibromyalgia   . HTN (hypertension)   . Hyperlipidemia   . Hypothyroidism   . Insomnia   . Iron deficiency   . Nonallergic rhinitis   . Osteopenia   . Overactive bladder   . Seasonal allergies   . SVD (spontaneous vaginal delivery)    x 1  . Upper airway cough syndrome   . Vertigo   . Vitiligo     Past Surgical History:  Procedure Laterality Date  . bone spurs     injections for bone spurs  . CESAREAN SECTION  2002   x 1  . DILATATION & CURETTAGE/HYSTEROSCOPY WITH MYOSURE N/A 10/12/2018   Procedure: DILATATION & CURETTAGE/HYSTEROSCOPY WITH MYOSURE POLYPECTOMY;  Surgeon:  Maxie Better, MD;  Location: [facility];  Service: Gynecology;  Laterality: N/A;  . DILATION AND CURETTAGE OF UTERUS  07/1996   mab  . HYSTEROSCOPY  10/12/2018   with D&C  . HYSTEROSCOPY  2014  . NASAL SEPTUM SURGERY  06/2011  . POLYPECTOMY  04/2012   D & C hysteroscopy for uterine polyps  . SINOSCOPY    . WISDOM TOOTH EXTRACTION      Family History  Problem Relation Age of Onset  . Arrhythmia Father        AFIB  . Congestive Heart Failure Father   . Hypertension Father   . Hyperlipidemia Father   . Dementia Father   . Cirrhosis Father   . Asthma Son   . Leukemia Maternal Grandfather   . Heart attack Maternal Grandfather   . Melanoma Maternal Grandmother   . Breast cancer Maternal Aunt   . Osteoporosis Mother   . Ovarian cancer Paternal Grandmother   . Breast cancer Other        maternal great aunt  . Allergic rhinitis Neg Hx   . Eczema Neg Hx   . Urticaria Neg Hx     Social History   Socioeconomic History  . Marital status: Legally Separated    Spouse name: Not on file  . Number of children: 2  . Years of education: 23  . Highest education level: Some college, no degree  Occupational History  . Occupation: Manufacturing systems engineer  Tobacco Use  . Smoking status: Never Smoker  . Smokeless tobacco: Never Used  Vaping Use  . Vaping Use: Never used  Substance and Sexual Activity  . Alcohol use: Yes    Alcohol/week: 0.0 standard drinks    Comment: socially/holidays  . Drug use: Never  . Sexual activity: Not Currently    Birth control/protection: None  Other Topics Concern  . Not on file  Social History Narrative   Lives at home with her 2 children   Separated 2 years   Right handed   Caffeine: quit October 2019   Social Determinants of Health   Financial Resource Strain:   . Difficulty of Paying Living Expenses: Not on file  Food Insecurity:   . Worried About Programme researcher, broadcasting/film/video in the Last Year: Not on file  . Ran Out of Food in the Last Year: Not on  file  Transportation Needs:   . Lack of Transportation (Medical): Not on file  . Lack of Transportation (Non-Medical): Not on file  Physical Activity:   . Days of Exercise per Week: Not on file  . Minutes of Exercise per Session: Not on file  Stress:   . Feeling of Stress : Not on file  Social Connections:   . Frequency of Communication with  Friends and Family: Not on file  . Frequency of Social Gatherings with Friends and Family: Not on file  . Attends Religious Services: Not on file  . Active Member of Clubs or Organizations: Not on file  . Attends Banker Meetings: Not on file  . Marital Status: Not on file  Intimate Partner Violence:   . Fear of Current or Ex-Partner: Not on file  . Emotionally Abused: Not on file  . Physically Abused: Not on file  . Sexually Abused: Not on file    Review of Systems  Constitutional: Positive for fatigue.  Respiratory: Positive for shortness of breath and wheezing.   Cardiovascular: Negative.   Gastrointestinal: Negative.   Musculoskeletal: Positive for arthralgias.    Vitals:   10/24/20 0907  BP: 122/78  Pulse: 78  Temp: 98.1 F (36.7 C)  SpO2: 99%     Physical Exam Constitutional:      Appearance: She is obese.  HENT:     Head: Normocephalic.     Nose: Nose normal. No congestion.     Mouth/Throat:     Mouth: Mucous membranes are moist.     Comments: Mallampati 3, crowded oropharynx Eyes:     General:        Right eye: No discharge.        Left eye: No discharge.     Pupils: Pupils are equal, round, and reactive to light.  Cardiovascular:     Rate and Rhythm: Normal rate.     Pulses: Normal pulses.     Heart sounds: Normal heart sounds. No murmur heard.  No friction rub.  Pulmonary:     Effort: No respiratory distress.     Breath sounds: No stridor. Wheezing present. No rhonchi.  Musculoskeletal:     Cervical back: No rigidity or tenderness.  Neurological:     General: No focal deficit present.      Mental Status: She is alert.  Psychiatric:        Mood and Affect: Mood normal.    Data Reviewed: No x-ray on record  Assessment:  Chronic cough -Associated wheezing and shortness of breath -Empiric use of albuterol does help  Chronic allergies -She is on Allegra, fluticasone nasal  History of snoring -Previous study negative -Informational material regarding sleep disordered breathing will be provided  Plan/Recommendations: Obtain chest x-ray  Obtain PFT with methacholine challenge  We will give a prescription for albuterol and Flovent  We will follow-up in 3 months  Consider sleep study if symptoms are congruent with having possible sleep apnea  Exercise and weight loss encouraged   Virl Diamond MD [facility] 10/24/2020, 9:17 AM  CC: Jarrett Soho, PA-C

## 2020-10-24 NOTE — Patient Instructions (Signed)
Chronic cough -Obtain PFT with methacholine challenge -Chest x-ray  -Albuterol inhaler -Flovent inhaler  Possible sleep apnea -Information for sleep apnea down below -You may consider a sleep study if you think it applies to you -Continue working on other health issues that can contribute to your tiredness during the day   Sleep Apnea Sleep apnea affects breathing during sleep. It causes breathing to stop for a short time or to become shallow. It can also increase the risk of:  Heart attack.  Stroke.  Being very overweight (obese).  Diabetes.  Heart failure.  Irregular heartbeat. The goal of treatment is to help you breathe normally again. What are the causes? There are three kinds of sleep apnea:  Obstructive sleep apnea. This is caused by a blocked or collapsed airway.  Central sleep apnea. This happens when the brain does not send the right signals to the muscles that control breathing.  Mixed sleep apnea. This is a combination of obstructive and central sleep apnea. The most common cause of this condition is a collapsed or blocked airway. This can happen if:  Your throat muscles are too relaxed.  Your tongue and tonsils are too large.  You are overweight.  Your airway is too small. What increases the risk?  Being overweight.  Smoking.  Having a small airway.  Being older.  Being female.  Drinking alcohol.  Taking medicines to calm yourself (sedatives or tranquilizers).  Having family members with the condition. What are the signs or symptoms?  Trouble staying asleep.  Being sleepy or tired during the day.  Getting angry a lot.  Loud snoring.  Headaches in the morning.  Not being able to focus your mind (concentrate).  Forgetting things.  Less interest in sex.  Mood swings.  Personality changes.  Feelings of sadness (depression).  Waking up a lot during the night to pee (urinate).  Dry mouth.  Sore throat. How is this  diagnosed?  Your medical history.  A physical exam.  A test that is done when you are sleeping (sleep study). The test is most often done in a sleep lab but may also be done at home. How is this treated?   Sleeping on your side.  Using a medicine to get rid of mucus in your nose (decongestant).  Avoiding the use of alcohol, medicines to help you relax, or certain pain medicines (narcotics).  Losing weight, if needed.  Changing your diet.  Not smoking.  Using a machine to open your airway while you sleep, such as: ? An oral appliance. This is a mouthpiece that shifts your lower jaw forward. ? A CPAP device. This device blows air through a mask when you breathe out (exhale). ? An EPAP device. This has valves that you put in each nostril. ? A BPAP device. This device blows air through a mask when you breathe in (inhale) and breathe out.  Having surgery if other treatments do not work. It is important to get treatment for sleep apnea. Without treatment, it can lead to:  High blood pressure.  Coronary artery disease.  In men, not being able to have an erection (impotence).  Reduced thinking ability. Follow these instructions at home: Lifestyle  Make changes that your doctor recommends.  Eat a healthy diet.  Lose weight if needed.  Avoid alcohol, medicines to help you relax, and some pain medicines.  Do not use any products that contain nicotine or tobacco, such as cigarettes, e-cigarettes, and chewing tobacco. If you need help quitting,  ask your doctor. General instructions  Take over-the-counter and prescription medicines only as told by your doctor.  If you were given a machine to use while you sleep, use it only as told by your doctor.  If you are having surgery, make sure to tell your doctor you have sleep apnea. You may need to bring your device with you.  Keep all follow-up visits as told by your doctor. This is important. Contact a doctor if:  The  machine that you were given to use during sleep bothers you or does not seem to be working.  You do not get better.  You get worse. Get help right away if:  Your chest hurts.  You have trouble breathing in enough air.  You have an uncomfortable feeling in your back, arms, or stomach.  You have trouble talking.  One side of your body feels weak.  A part of your face is hanging down. These symptoms may be an emergency. Do not wait to see if the symptoms will go away. Get medical help right away. Call your local emergency services (911 in the [place] Do not drive yourself to the hospital. Summary  This condition affects breathing during sleep.  The most common cause is a collapsed or blocked airway.  The goal of treatment is to help you breathe normally while you sleep. This information is not intended to replace advice given to you by your health care provider. Make sure you discuss any questions you have with your health care provider. Document Revised: 09/10/2018 Document Reviewed: 07/20/2018 Elsevier Patient Education  2020 [employer].

## 2020-11-08 DIAGNOSIS — L728 Other follicular cysts of the skin and subcutaneous tissue: Secondary | ICD-10-CM | POA: Diagnosis not present

## 2020-11-08 DIAGNOSIS — M7989 Other specified soft tissue disorders: Secondary | ICD-10-CM | POA: Diagnosis not present

## 2020-11-08 DIAGNOSIS — R238 Other skin changes: Secondary | ICD-10-CM | POA: Diagnosis not present

## 2020-11-21 DIAGNOSIS — L03317 Cellulitis of buttock: Secondary | ICD-10-CM | POA: Diagnosis not present

## 2020-11-21 DIAGNOSIS — L0231 Cutaneous abscess of buttock: Secondary | ICD-10-CM | POA: Diagnosis not present

## 2020-11-22 DIAGNOSIS — I10 Essential (primary) hypertension: Secondary | ICD-10-CM | POA: Diagnosis not present

## 2020-11-22 DIAGNOSIS — E7800 Pure hypercholesterolemia, unspecified: Secondary | ICD-10-CM | POA: Diagnosis not present

## 2020-11-22 DIAGNOSIS — D509 Iron deficiency anemia, unspecified: Secondary | ICD-10-CM | POA: Diagnosis not present

## 2020-11-22 DIAGNOSIS — L729 Follicular cyst of the skin and subcutaneous tissue, unspecified: Secondary | ICD-10-CM | POA: Diagnosis not present

## 2021-01-01 DIAGNOSIS — G894 Chronic pain syndrome: Secondary | ICD-10-CM | POA: Diagnosis not present

## 2021-01-01 DIAGNOSIS — M797 Fibromyalgia: Secondary | ICD-10-CM | POA: Diagnosis not present

## 2021-01-01 DIAGNOSIS — M1710 Unilateral primary osteoarthritis, unspecified knee: Secondary | ICD-10-CM | POA: Diagnosis not present

## 2021-01-01 DIAGNOSIS — M79673 Pain in unspecified foot: Secondary | ICD-10-CM | POA: Diagnosis not present

## 2021-01-29 DIAGNOSIS — M549 Dorsalgia, unspecified: Secondary | ICD-10-CM | POA: Diagnosis not present

## 2021-01-29 DIAGNOSIS — G894 Chronic pain syndrome: Secondary | ICD-10-CM | POA: Diagnosis not present

## 2021-01-29 DIAGNOSIS — M170 Bilateral primary osteoarthritis of knee: Secondary | ICD-10-CM | POA: Diagnosis not present

## 2021-01-29 DIAGNOSIS — M797 Fibromyalgia: Secondary | ICD-10-CM | POA: Diagnosis not present

## 2021-03-01 DIAGNOSIS — G894 Chronic pain syndrome: Secondary | ICD-10-CM | POA: Diagnosis not present

## 2021-03-01 DIAGNOSIS — M170 Bilateral primary osteoarthritis of knee: Secondary | ICD-10-CM | POA: Diagnosis not present

## 2021-03-01 DIAGNOSIS — M5459 Other low back pain: Secondary | ICD-10-CM | POA: Diagnosis not present

## 2021-03-01 DIAGNOSIS — M797 Fibromyalgia: Secondary | ICD-10-CM | POA: Diagnosis not present

## 2021-03-26 DIAGNOSIS — M170 Bilateral primary osteoarthritis of knee: Secondary | ICD-10-CM | POA: Diagnosis not present

## 2021-04-23 DIAGNOSIS — M170 Bilateral primary osteoarthritis of knee: Secondary | ICD-10-CM | POA: Diagnosis not present

## 2021-04-23 DIAGNOSIS — M5459 Other low back pain: Secondary | ICD-10-CM | POA: Diagnosis not present

## 2021-04-23 DIAGNOSIS — G894 Chronic pain syndrome: Secondary | ICD-10-CM | POA: Diagnosis not present

## 2021-04-23 DIAGNOSIS — M797 Fibromyalgia: Secondary | ICD-10-CM | POA: Diagnosis not present

## 2021-04-25 ENCOUNTER — Other Ambulatory Visit: Payer: Self-pay | Admitting: Physician Assistant

## 2021-04-25 DIAGNOSIS — S83209A Unspecified tear of unspecified meniscus, current injury, unspecified knee, initial encounter: Secondary | ICD-10-CM

## 2021-05-11 ENCOUNTER — Ambulatory Visit
Admission: RE | Admit: 2021-05-11 | Discharge: 2021-05-11 | Disposition: A | Discharge status: Home / Self Care | Payer: BC Managed Care – PPO | Source: Ambulatory Visit | Attending: Physician Assistant | Admitting: Physician Assistant

## 2021-05-11 ENCOUNTER — Other Ambulatory Visit: Payer: Self-pay

## 2021-05-11 DIAGNOSIS — M25461 Effusion, right knee: Secondary | ICD-10-CM | POA: Diagnosis not present

## 2021-05-11 DIAGNOSIS — S83209A Unspecified tear of unspecified meniscus, current injury, unspecified knee, initial encounter: Secondary | ICD-10-CM

## 2021-05-11 DIAGNOSIS — S83281A Other tear of lateral meniscus, current injury, right knee, initial encounter: Secondary | ICD-10-CM | POA: Diagnosis not present

## 2021-05-11 DIAGNOSIS — R600 Localized edema: Secondary | ICD-10-CM | POA: Diagnosis not present

## 2021-05-11 DIAGNOSIS — M1711 Unilateral primary osteoarthritis, right knee: Secondary | ICD-10-CM | POA: Diagnosis not present

## 2021-05-11 DIAGNOSIS — M8568 Other cyst of bone, other site: Secondary | ICD-10-CM | POA: Diagnosis not present

## 2021-05-11 DIAGNOSIS — M7122 Synovial cyst of popliteal space [Baker], left knee: Secondary | ICD-10-CM | POA: Diagnosis not present

## 2021-05-11 DIAGNOSIS — M25462 Effusion, left knee: Secondary | ICD-10-CM | POA: Diagnosis not present

## 2021-05-11 DIAGNOSIS — M1712 Unilateral primary osteoarthritis, left knee: Secondary | ICD-10-CM | POA: Diagnosis not present

## 2021-05-16 DIAGNOSIS — H9209 Otalgia, unspecified ear: Secondary | ICD-10-CM | POA: Diagnosis not present

## 2021-05-29 DIAGNOSIS — Z79899 Other long term (current) drug therapy: Secondary | ICD-10-CM | POA: Diagnosis not present

## 2021-05-29 DIAGNOSIS — Z79891 Long term (current) use of opiate analgesic: Secondary | ICD-10-CM | POA: Diagnosis not present

## 2021-05-29 DIAGNOSIS — M797 Fibromyalgia: Secondary | ICD-10-CM | POA: Diagnosis not present

## 2021-05-29 DIAGNOSIS — M5459 Other low back pain: Secondary | ICD-10-CM | POA: Diagnosis not present

## 2021-05-29 DIAGNOSIS — G894 Chronic pain syndrome: Secondary | ICD-10-CM | POA: Diagnosis not present

## 2021-05-29 DIAGNOSIS — M170 Bilateral primary osteoarthritis of knee: Secondary | ICD-10-CM | POA: Diagnosis not present

## 2021-06-24 DIAGNOSIS — M170 Bilateral primary osteoarthritis of knee: Secondary | ICD-10-CM | POA: Diagnosis not present

## 2021-07-03 DIAGNOSIS — M170 Bilateral primary osteoarthritis of knee: Secondary | ICD-10-CM | POA: Diagnosis not present

## 2021-07-04 DIAGNOSIS — M170 Bilateral primary osteoarthritis of knee: Secondary | ICD-10-CM | POA: Diagnosis not present

## 2021-07-04 DIAGNOSIS — M797 Fibromyalgia: Secondary | ICD-10-CM | POA: Diagnosis not present

## 2021-07-04 DIAGNOSIS — G894 Chronic pain syndrome: Secondary | ICD-10-CM | POA: Diagnosis not present

## 2021-07-04 DIAGNOSIS — M5459 Other low back pain: Secondary | ICD-10-CM | POA: Diagnosis not present

## 2021-07-31 DIAGNOSIS — N3000 Acute cystitis without hematuria: Secondary | ICD-10-CM | POA: Diagnosis not present

## 2021-08-05 DIAGNOSIS — M170 Bilateral primary osteoarthritis of knee: Secondary | ICD-10-CM | POA: Diagnosis not present

## 2021-08-05 DIAGNOSIS — G894 Chronic pain syndrome: Secondary | ICD-10-CM | POA: Diagnosis not present

## 2021-08-05 DIAGNOSIS — M5459 Other low back pain: Secondary | ICD-10-CM | POA: Diagnosis not present

## 2021-08-05 DIAGNOSIS — M797 Fibromyalgia: Secondary | ICD-10-CM | POA: Diagnosis not present

## 2021-08-13 DIAGNOSIS — M25562 Pain in left knee: Secondary | ICD-10-CM | POA: Diagnosis not present

## 2021-08-13 DIAGNOSIS — M170 Bilateral primary osteoarthritis of knee: Secondary | ICD-10-CM | POA: Diagnosis not present

## 2021-08-13 DIAGNOSIS — M25561 Pain in right knee: Secondary | ICD-10-CM | POA: Diagnosis not present

## 2021-08-26 DIAGNOSIS — M25561 Pain in right knee: Secondary | ICD-10-CM | POA: Diagnosis not present

## 2021-08-26 DIAGNOSIS — M170 Bilateral primary osteoarthritis of knee: Secondary | ICD-10-CM | POA: Diagnosis not present

## 2021-08-26 DIAGNOSIS — M25562 Pain in left knee: Secondary | ICD-10-CM | POA: Diagnosis not present

## 2021-09-03 DIAGNOSIS — M170 Bilateral primary osteoarthritis of knee: Secondary | ICD-10-CM | POA: Diagnosis not present

## 2021-09-09 DIAGNOSIS — M170 Bilateral primary osteoarthritis of knee: Secondary | ICD-10-CM | POA: Diagnosis not present

## 2021-09-09 DIAGNOSIS — G894 Chronic pain syndrome: Secondary | ICD-10-CM | POA: Diagnosis not present

## 2021-09-09 DIAGNOSIS — M5459 Other low back pain: Secondary | ICD-10-CM | POA: Diagnosis not present

## 2021-09-09 DIAGNOSIS — M797 Fibromyalgia: Secondary | ICD-10-CM | POA: Diagnosis not present

## 2021-09-22 DIAGNOSIS — R350 Frequency of micturition: Secondary | ICD-10-CM | POA: Diagnosis not present

## 2021-10-07 DIAGNOSIS — I10 Essential (primary) hypertension: Secondary | ICD-10-CM | POA: Diagnosis not present

## 2021-10-07 DIAGNOSIS — F3341 Major depressive disorder, recurrent, in partial remission: Secondary | ICD-10-CM | POA: Diagnosis not present

## 2021-10-07 DIAGNOSIS — E038 Other specified hypothyroidism: Secondary | ICD-10-CM | POA: Diagnosis not present

## 2021-10-07 DIAGNOSIS — Z0000 Encounter for general adult medical examination without abnormal findings: Secondary | ICD-10-CM | POA: Diagnosis not present

## 2021-10-07 DIAGNOSIS — E7800 Pure hypercholesterolemia, unspecified: Secondary | ICD-10-CM | POA: Diagnosis not present

## 2021-10-07 DIAGNOSIS — M797 Fibromyalgia: Secondary | ICD-10-CM | POA: Diagnosis not present

## 2021-11-06 ENCOUNTER — Other Ambulatory Visit: Payer: Self-pay | Admitting: Pulmonary Disease

## 2021-11-06 DIAGNOSIS — G894 Chronic pain syndrome: Secondary | ICD-10-CM | POA: Diagnosis not present

## 2021-11-06 DIAGNOSIS — M797 Fibromyalgia: Secondary | ICD-10-CM | POA: Diagnosis not present

## 2021-11-06 DIAGNOSIS — M170 Bilateral primary osteoarthritis of knee: Secondary | ICD-10-CM | POA: Diagnosis not present

## 2021-11-06 DIAGNOSIS — M5459 Other low back pain: Secondary | ICD-10-CM | POA: Diagnosis not present

## 2021-11-21 DIAGNOSIS — N390 Urinary tract infection, site not specified: Secondary | ICD-10-CM | POA: Diagnosis not present

## 2021-11-21 DIAGNOSIS — N3941 Urge incontinence: Secondary | ICD-10-CM | POA: Diagnosis not present

## 2021-11-21 DIAGNOSIS — B9620 Unspecified Escherichia coli [E. coli] as the cause of diseases classified elsewhere: Secondary | ICD-10-CM | POA: Diagnosis not present

## 2021-12-18 DIAGNOSIS — M5459 Other low back pain: Secondary | ICD-10-CM | POA: Diagnosis not present

## 2021-12-18 DIAGNOSIS — G894 Chronic pain syndrome: Secondary | ICD-10-CM | POA: Diagnosis not present

## 2021-12-18 DIAGNOSIS — Z79899 Other long term (current) drug therapy: Secondary | ICD-10-CM | POA: Diagnosis not present

## 2021-12-18 DIAGNOSIS — Z79891 Long term (current) use of opiate analgesic: Secondary | ICD-10-CM | POA: Diagnosis not present

## 2021-12-18 DIAGNOSIS — M797 Fibromyalgia: Secondary | ICD-10-CM | POA: Diagnosis not present

## 2021-12-18 DIAGNOSIS — M170 Bilateral primary osteoarthritis of knee: Secondary | ICD-10-CM | POA: Diagnosis not present

## 2021-12-25 DIAGNOSIS — I10 Essential (primary) hypertension: Secondary | ICD-10-CM | POA: Diagnosis not present

## 2021-12-25 DIAGNOSIS — G43019 Migraine without aura, intractable, without status migrainosus: Secondary | ICD-10-CM | POA: Diagnosis not present

## 2021-12-25 DIAGNOSIS — F3341 Major depressive disorder, recurrent, in partial remission: Secondary | ICD-10-CM | POA: Diagnosis not present

## 2021-12-25 DIAGNOSIS — E669 Obesity, unspecified: Secondary | ICD-10-CM | POA: Diagnosis not present

## 2022-01-02 DIAGNOSIS — N390 Urinary tract infection, site not specified: Secondary | ICD-10-CM | POA: Diagnosis not present

## 2022-01-02 DIAGNOSIS — R351 Nocturia: Secondary | ICD-10-CM | POA: Diagnosis not present

## 2022-01-02 DIAGNOSIS — B9620 Unspecified Escherichia coli [E. coli] as the cause of diseases classified elsewhere: Secondary | ICD-10-CM | POA: Diagnosis not present

## 2022-01-02 DIAGNOSIS — R350 Frequency of micturition: Secondary | ICD-10-CM | POA: Diagnosis not present

## 2022-01-02 DIAGNOSIS — N3946 Mixed incontinence: Secondary | ICD-10-CM | POA: Diagnosis not present

## 2022-02-06 DIAGNOSIS — M797 Fibromyalgia: Secondary | ICD-10-CM | POA: Diagnosis not present

## 2022-02-06 DIAGNOSIS — M47812 Spondylosis without myelopathy or radiculopathy, cervical region: Secondary | ICD-10-CM | POA: Diagnosis not present

## 2022-02-06 DIAGNOSIS — M170 Bilateral primary osteoarthritis of knee: Secondary | ICD-10-CM | POA: Diagnosis not present

## 2022-02-25 DIAGNOSIS — N3946 Mixed incontinence: Secondary | ICD-10-CM | POA: Diagnosis not present

## 2022-02-25 DIAGNOSIS — R8271 Bacteriuria: Secondary | ICD-10-CM | POA: Diagnosis not present

## 2022-03-13 DIAGNOSIS — G894 Chronic pain syndrome: Secondary | ICD-10-CM | POA: Diagnosis not present

## 2022-04-09 DIAGNOSIS — Z79891 Long term (current) use of opiate analgesic: Secondary | ICD-10-CM | POA: Diagnosis not present

## 2022-04-09 DIAGNOSIS — G894 Chronic pain syndrome: Secondary | ICD-10-CM | POA: Diagnosis not present

## 2022-04-10 DIAGNOSIS — R350 Frequency of micturition: Secondary | ICD-10-CM | POA: Diagnosis not present

## 2022-04-10 DIAGNOSIS — N3946 Mixed incontinence: Secondary | ICD-10-CM | POA: Diagnosis not present

## 2022-04-23 DIAGNOSIS — R5383 Other fatigue: Secondary | ICD-10-CM | POA: Diagnosis not present

## 2022-04-23 DIAGNOSIS — E038 Other specified hypothyroidism: Secondary | ICD-10-CM | POA: Diagnosis not present

## 2022-04-23 DIAGNOSIS — G4719 Other hypersomnia: Secondary | ICD-10-CM | POA: Diagnosis not present

## 2022-04-23 DIAGNOSIS — I10 Essential (primary) hypertension: Secondary | ICD-10-CM | POA: Diagnosis not present

## 2022-05-01 DIAGNOSIS — N3946 Mixed incontinence: Secondary | ICD-10-CM | POA: Diagnosis not present

## 2022-05-11 DIAGNOSIS — R609 Edema, unspecified: Secondary | ICD-10-CM | POA: Diagnosis not present

## 2022-05-11 DIAGNOSIS — T63441A Toxic effect of venom of bees, accidental (unintentional), initial encounter: Secondary | ICD-10-CM | POA: Diagnosis not present

## 2022-05-11 DIAGNOSIS — L508 Other urticaria: Secondary | ICD-10-CM | POA: Diagnosis not present

## 2022-05-13 DIAGNOSIS — M545 Low back pain, unspecified: Secondary | ICD-10-CM | POA: Diagnosis not present

## 2022-06-11 DIAGNOSIS — M546 Pain in thoracic spine: Secondary | ICD-10-CM | POA: Diagnosis not present

## 2022-06-26 DIAGNOSIS — N3942 Incontinence without sensory awareness: Secondary | ICD-10-CM | POA: Diagnosis not present

## 2022-06-26 DIAGNOSIS — N3946 Mixed incontinence: Secondary | ICD-10-CM | POA: Diagnosis not present

## 2022-07-15 DIAGNOSIS — E038 Other specified hypothyroidism: Secondary | ICD-10-CM | POA: Diagnosis not present

## 2022-07-17 DIAGNOSIS — M2550 Pain in unspecified joint: Secondary | ICD-10-CM | POA: Diagnosis not present

## 2022-08-20 DIAGNOSIS — M2550 Pain in unspecified joint: Secondary | ICD-10-CM | POA: Diagnosis not present

## 2022-08-27 DIAGNOSIS — G4719 Other hypersomnia: Secondary | ICD-10-CM | POA: Diagnosis not present

## 2022-08-27 DIAGNOSIS — E7800 Pure hypercholesterolemia, unspecified: Secondary | ICD-10-CM | POA: Diagnosis not present

## 2022-08-27 DIAGNOSIS — E038 Other specified hypothyroidism: Secondary | ICD-10-CM | POA: Diagnosis not present

## 2022-08-27 DIAGNOSIS — I10 Essential (primary) hypertension: Secondary | ICD-10-CM | POA: Diagnosis not present

## 2022-09-03 DIAGNOSIS — M170 Bilateral primary osteoarthritis of knee: Secondary | ICD-10-CM | POA: Diagnosis not present

## 2022-10-01 DIAGNOSIS — R8271 Bacteriuria: Secondary | ICD-10-CM | POA: Diagnosis not present

## 2022-10-01 DIAGNOSIS — N3941 Urge incontinence: Secondary | ICD-10-CM | POA: Diagnosis not present

## 2022-10-01 DIAGNOSIS — N3942 Incontinence without sensory awareness: Secondary | ICD-10-CM | POA: Diagnosis not present

## 2022-10-02 DIAGNOSIS — M2550 Pain in unspecified joint: Secondary | ICD-10-CM | POA: Diagnosis not present

## 2022-10-02 DIAGNOSIS — Z1231 Encounter for screening mammogram for malignant neoplasm of breast: Secondary | ICD-10-CM | POA: Diagnosis not present

## 2022-10-28 DIAGNOSIS — Z124 Encounter for screening for malignant neoplasm of cervix: Secondary | ICD-10-CM | POA: Diagnosis not present

## 2022-10-28 DIAGNOSIS — Z6827 Body mass index (BMI) 27.0-27.9, adult: Secondary | ICD-10-CM | POA: Diagnosis not present

## 2022-10-28 DIAGNOSIS — Z01411 Encounter for gynecological examination (general) (routine) with abnormal findings: Secondary | ICD-10-CM | POA: Diagnosis not present

## 2022-10-28 DIAGNOSIS — Z01419 Encounter for gynecological examination (general) (routine) without abnormal findings: Secondary | ICD-10-CM | POA: Diagnosis not present

## 2022-11-05 DIAGNOSIS — M2550 Pain in unspecified joint: Secondary | ICD-10-CM | POA: Diagnosis not present

## 2022-11-05 DIAGNOSIS — Z79891 Long term (current) use of opiate analgesic: Secondary | ICD-10-CM | POA: Diagnosis not present

## 2022-12-03 DIAGNOSIS — M2550 Pain in unspecified joint: Secondary | ICD-10-CM | POA: Diagnosis not present

## 2022-12-31 DIAGNOSIS — M25561 Pain in right knee: Secondary | ICD-10-CM | POA: Diagnosis not present

## 2023-01-26 DIAGNOSIS — L259 Unspecified contact dermatitis, unspecified cause: Secondary | ICD-10-CM | POA: Diagnosis not present

## 2023-02-10 DIAGNOSIS — M25561 Pain in right knee: Secondary | ICD-10-CM | POA: Diagnosis not present

## 2023-03-12 DIAGNOSIS — M2550 Pain in unspecified joint: Secondary | ICD-10-CM | POA: Diagnosis not present

## 2023-03-25 DIAGNOSIS — E7800 Pure hypercholesterolemia, unspecified: Secondary | ICD-10-CM | POA: Diagnosis not present

## 2023-03-25 DIAGNOSIS — Z0000 Encounter for general adult medical examination without abnormal findings: Secondary | ICD-10-CM | POA: Diagnosis not present

## 2023-03-25 DIAGNOSIS — G43019 Migraine without aura, intractable, without status migrainosus: Secondary | ICD-10-CM | POA: Diagnosis not present

## 2023-03-25 DIAGNOSIS — E038 Other specified hypothyroidism: Secondary | ICD-10-CM | POA: Diagnosis not present

## 2023-03-25 DIAGNOSIS — Z23 Encounter for immunization: Secondary | ICD-10-CM | POA: Diagnosis not present

## 2023-03-25 DIAGNOSIS — I10 Essential (primary) hypertension: Secondary | ICD-10-CM | POA: Diagnosis not present

## 2023-04-02 DIAGNOSIS — R350 Frequency of micturition: Secondary | ICD-10-CM | POA: Diagnosis not present

## 2023-04-02 DIAGNOSIS — N3946 Mixed incontinence: Secondary | ICD-10-CM | POA: Diagnosis not present

## 2023-04-13 DIAGNOSIS — R945 Abnormal results of liver function studies: Secondary | ICD-10-CM | POA: Diagnosis not present

## 2023-04-30 DIAGNOSIS — Z79891 Long term (current) use of opiate analgesic: Secondary | ICD-10-CM | POA: Diagnosis not present

## 2023-04-30 DIAGNOSIS — M25561 Pain in right knee: Secondary | ICD-10-CM | POA: Diagnosis not present

## 2023-05-28 DIAGNOSIS — M25561 Pain in right knee: Secondary | ICD-10-CM | POA: Diagnosis not present

## 2023-06-17 DIAGNOSIS — K648 Other hemorrhoids: Secondary | ICD-10-CM | POA: Diagnosis not present

## 2023-06-17 DIAGNOSIS — Z1211 Encounter for screening for malignant neoplasm of colon: Secondary | ICD-10-CM | POA: Diagnosis not present

## 2023-06-25 DIAGNOSIS — M25561 Pain in right knee: Secondary | ICD-10-CM | POA: Diagnosis not present

## 2023-07-23 DIAGNOSIS — M25551 Pain in right hip: Secondary | ICD-10-CM | POA: Diagnosis not present

## 2023-09-03 DIAGNOSIS — Z79891 Long term (current) use of opiate analgesic: Secondary | ICD-10-CM | POA: Diagnosis not present

## 2023-09-03 DIAGNOSIS — M25551 Pain in right hip: Secondary | ICD-10-CM | POA: Diagnosis not present

## 2023-10-21 DIAGNOSIS — M25551 Pain in right hip: Secondary | ICD-10-CM | POA: Diagnosis not present

## 2023-10-23 DIAGNOSIS — M170 Bilateral primary osteoarthritis of knee: Secondary | ICD-10-CM | POA: Diagnosis not present

## 2023-10-23 DIAGNOSIS — M7711 Lateral epicondylitis, right elbow: Secondary | ICD-10-CM | POA: Diagnosis not present

## 2023-11-18 DIAGNOSIS — M25551 Pain in right hip: Secondary | ICD-10-CM | POA: Diagnosis not present

## 2023-12-16 DIAGNOSIS — M7711 Lateral epicondylitis, right elbow: Secondary | ICD-10-CM | POA: Diagnosis not present

## 2023-12-22 DIAGNOSIS — M7711 Lateral epicondylitis, right elbow: Secondary | ICD-10-CM | POA: Diagnosis not present

## 2023-12-22 DIAGNOSIS — M25561 Pain in right knee: Secondary | ICD-10-CM | POA: Diagnosis not present

## 2023-12-29 DIAGNOSIS — M7711 Lateral epicondylitis, right elbow: Secondary | ICD-10-CM | POA: Diagnosis not present

## 2024-01-05 DIAGNOSIS — M7711 Lateral epicondylitis, right elbow: Secondary | ICD-10-CM | POA: Diagnosis not present

## 2024-02-04 DIAGNOSIS — M25561 Pain in right knee: Secondary | ICD-10-CM | POA: Diagnosis not present

## 2024-02-04 DIAGNOSIS — Z79891 Long term (current) use of opiate analgesic: Secondary | ICD-10-CM | POA: Diagnosis not present

## 2024-03-03 DIAGNOSIS — M25561 Pain in right knee: Secondary | ICD-10-CM | POA: Diagnosis not present

## 2024-04-14 DIAGNOSIS — G4719 Other hypersomnia: Secondary | ICD-10-CM | POA: Diagnosis not present

## 2024-04-14 DIAGNOSIS — Z0000 Encounter for general adult medical examination without abnormal findings: Secondary | ICD-10-CM | POA: Diagnosis not present

## 2024-04-14 DIAGNOSIS — E038 Other specified hypothyroidism: Secondary | ICD-10-CM | POA: Diagnosis not present

## 2024-04-14 DIAGNOSIS — L237 Allergic contact dermatitis due to plants, except food: Secondary | ICD-10-CM | POA: Diagnosis not present

## 2024-04-14 DIAGNOSIS — M797 Fibromyalgia: Secondary | ICD-10-CM | POA: Diagnosis not present

## 2024-04-14 DIAGNOSIS — M25561 Pain in right knee: Secondary | ICD-10-CM | POA: Diagnosis not present

## 2024-04-14 DIAGNOSIS — F3341 Major depressive disorder, recurrent, in partial remission: Secondary | ICD-10-CM | POA: Diagnosis not present

## 2024-04-14 DIAGNOSIS — E7800 Pure hypercholesterolemia, unspecified: Secondary | ICD-10-CM | POA: Diagnosis not present

## 2024-04-14 DIAGNOSIS — I10 Essential (primary) hypertension: Secondary | ICD-10-CM | POA: Diagnosis not present

## 2024-05-31 DIAGNOSIS — Z79891 Long term (current) use of opiate analgesic: Secondary | ICD-10-CM | POA: Diagnosis not present

## 2024-05-31 DIAGNOSIS — M25561 Pain in right knee: Secondary | ICD-10-CM | POA: Diagnosis not present

## 2024-07-13 DIAGNOSIS — M25561 Pain in right knee: Secondary | ICD-10-CM | POA: Diagnosis not present

## 2024-07-22 ENCOUNTER — Other Ambulatory Visit: Payer: Self-pay

## 2024-07-22 ENCOUNTER — Emergency Department (HOSPITAL_BASED_OUTPATIENT_CLINIC_OR_DEPARTMENT_OTHER)
Admission: EM | Admit: 2024-07-22 | Discharge: 2024-07-22 | Disposition: A | Discharge status: Home / Self Care | Attending: Emergency Medicine | Admitting: Emergency Medicine

## 2024-07-22 ENCOUNTER — Emergency Department (HOSPITAL_BASED_OUTPATIENT_CLINIC_OR_DEPARTMENT_OTHER)

## 2024-07-22 DIAGNOSIS — E876 Hypokalemia: Secondary | ICD-10-CM | POA: Insufficient documentation

## 2024-07-22 DIAGNOSIS — I10 Essential (primary) hypertension: Secondary | ICD-10-CM | POA: Diagnosis not present

## 2024-07-22 DIAGNOSIS — N3289 Other specified disorders of bladder: Secondary | ICD-10-CM | POA: Diagnosis not present

## 2024-07-22 DIAGNOSIS — Z79899 Other long term (current) drug therapy: Secondary | ICD-10-CM | POA: Diagnosis not present

## 2024-07-22 DIAGNOSIS — R109 Unspecified abdominal pain: Secondary | ICD-10-CM | POA: Insufficient documentation

## 2024-07-22 LAB — CBC WITH DIFFERENTIAL/PLATELET
Abs Immature Granulocytes: 0.03 K/uL (ref 0.00–0.07)
Basophils Absolute: 0.1 K/uL (ref 0.0–0.1)
Basophils Relative: 1 %
Eosinophils Absolute: 0.7 K/uL — ABNORMAL HIGH (ref 0.0–0.5)
Eosinophils Relative: 9 %
HCT: 35.6 % — ABNORMAL LOW (ref 36.0–46.0)
Hemoglobin: 12.3 g/dL (ref 12.0–15.0)
Immature Granulocytes: 0 %
Lymphocytes Relative: 26 %
Lymphs Abs: 2.1 K/uL (ref 0.7–4.0)
MCH: 28.5 pg (ref 26.0–34.0)
MCHC: 34.6 g/dL (ref 30.0–36.0)
MCV: 82.4 fL (ref 80.0–100.0)
Monocytes Absolute: 0.6 K/uL (ref 0.1–1.0)
Monocytes Relative: 8 %
Neutro Abs: 4.5 K/uL (ref 1.7–7.7)
Neutrophils Relative %: 56 %
Platelets: 312 K/uL (ref 150–400)
RBC: 4.32 MIL/uL (ref 3.87–5.11)
RDW: 13 % (ref 11.5–15.5)
WBC: 8.2 K/uL (ref 4.0–10.5)
nRBC: 0 % (ref 0.0–0.2)

## 2024-07-22 LAB — COMPREHENSIVE METABOLIC PANEL WITH GFR
ALT: 58 U/L — ABNORMAL HIGH (ref 0–44)
AST: 37 U/L (ref 15–41)
Albumin: 4.3 g/dL (ref 3.5–5.0)
Alkaline Phosphatase: 103 U/L (ref 38–126)
Anion gap: 13 (ref 5–15)
BUN: 12 mg/dL (ref 6–20)
CO2: 18 mmol/L — ABNORMAL LOW (ref 22–32)
Calcium: 9.3 mg/dL (ref 8.9–10.3)
Chloride: 106 mmol/L (ref 98–111)
Creatinine, Ser: 0.85 mg/dL (ref 0.44–1.00)
GFR, Estimated: >60 mL/min (ref >60)
Glucose, Bld: 78 mg/dL (ref 70–99)
Potassium: 3.4 mmol/L — ABNORMAL LOW (ref 3.5–5.1)
Sodium: 136 mmol/L (ref 135–145)
Total Bilirubin: 0.4 mg/dL (ref 0.0–1.2)
Total Protein: 7 g/dL (ref 6.5–8.1)

## 2024-07-22 LAB — URINALYSIS, ROUTINE W REFLEX MICROSCOPIC
Bilirubin Urine: NEGATIVE
Glucose, UA: NEGATIVE mg/dL
Hgb urine dipstick: NEGATIVE
Ketones, ur: NEGATIVE mg/dL
Leukocytes,Ua: NEGATIVE
Nitrite: NEGATIVE
Protein, ur: NEGATIVE mg/dL
Specific Gravity, Urine: <1.005 — ABNORMAL LOW (ref 1.005–1.030)
pH: 6.5 (ref 5.0–8.0)

## 2024-07-22 LAB — LIPASE, BLOOD: Lipase: 37 U/L (ref 11–51)

## 2024-07-22 LAB — PREGNANCY, URINE: Preg Test, Ur: NEGATIVE

## 2024-07-22 MED ORDER — ONDANSETRON HCL 4 MG/2ML IJ SOLN
4.0000 mg | Freq: Once | INTRAMUSCULAR | Status: AC
  Administered 2024-07-22: 4 mg via INTRAVENOUS
  Filled 2024-07-22: qty 2

## 2024-07-22 MED ORDER — CYCLOBENZAPRINE HCL 10 MG PO TABS: 10.0000 mg | ORAL_TABLET | Freq: Two times a day (BID) | ORAL | 0 refills | Status: AC | PRN

## 2024-07-22 MED ORDER — KETOROLAC TROMETHAMINE 15 MG/ML IJ SOLN
15.0000 mg | Freq: Once | INTRAMUSCULAR | Status: AC
  Administered 2024-07-22: 15 mg via INTRAVENOUS
  Filled 2024-07-22: qty 1

## 2024-07-22 NOTE — Discharge Instructions (Signed)
 Please read and follow all provided instructions.  Your diagnoses today include:  1. Flank pain     Tests performed today include: Complete blood cell count: Your white and red blood cell counts were normal Complete metabolic panel: Liver function ALT was slightly elevated, potassium was just slightly low Lipase (pancreas function test): Was normal Urinalysis (urine test): No sign of infection Pregnancy test (urine or blood, in women only): Negative CT scan of your abdomen pelvis did not show any severe problems or obvious causes of your pain today Vital signs. See below for your results today.   Medications prescribed:  Flexeril  (cyclobenzaprine ) - muscle relaxer medication  DO NOT drive or perform any activities that require you to be awake and alert because this medicine can make you drowsy.   Take any prescribed medications only as directed.  Home care instructions:  Follow any educational materials contained in this packet. Continue over-the-counter NSAIDs and Tylenol Use home tramadol and heat, Lidoderm  patches if desired  Follow-up instructions: Please follow-up with your primary care provider in the next 3 days for further evaluation of your symptoms.    Return instructions:  SEEK IMMEDIATE MEDICAL ATTENTION IF: The pain does not go away or becomes severe  A temperature above 101F develops  Repeated vomiting occurs (multiple episodes)  The pain becomes localized to portions of the abdomen. The right side could possibly be appendicitis. In an adult, the left lower portion of the abdomen could be colitis or diverticulitis.  Blood is being passed in stools or vomit (bright red or black tarry stools)  You develop chest pain, difficulty breathing, dizziness or fainting, or become confused, poorly responsive, or inconsolable (young children) If you have any other emergent concerns regarding your health  Additional Information: Abdominal (belly) pain can be caused by many  things. Your caregiver performed an examination and possibly ordered blood/urine tests and imaging (CT scan, x-rays, ultrasound). Many cases can be observed and treated at home after initial evaluation in the emergency department. Even though you are being discharged home, abdominal pain can be unpredictable. Therefore, you need a repeated exam if your pain does not resolve, returns, or worsens. Most patients with abdominal pain don't have to be admitted to the hospital or have surgery, but serious problems like appendicitis and gallbladder attacks can start out as nonspecific pain. Many abdominal conditions cannot be diagnosed in one visit, so follow-up evaluations are very important.  Your vital signs today were: BP 116/75   Pulse 80   Temp 97.8 F (36.6 C) (Oral)   Resp 18   SpO2 94%  If your blood pressure (bp) was elevated above 135/85 this visit, please have this repeated by your doctor within one month. --------------

## 2024-07-22 NOTE — ED Notes (Signed)
 Patient transported to CT

## 2024-07-22 NOTE — ED Provider Notes (Signed)
 [place] EMERGENCY DEPARTMENT AT [facility] Provider Note   CSN: [id number] Arrival date & time: 07/22/24  1421     Patient presents with: Back Pain   Melissa Chandler is a 48 y.o. female.   Patient with history of high cholesterol, hypertension, overactive bladder on prophylactic nitrofurantoin -- presents to the emergency department today for evaluation of severe right-sided flank pain.  Initially patient thought that she pulled a muscle as pain was milder.  About 4 hours ago, patient was at work and the pain became severe.  No vomiting.  No lightheadedness or syncope.  Denies history of kidney stones. She reports ongoing urinary urgency.  No chest pain, shortness of breath, diaphoresis.       Prior to Admission medications   Medication Sig Start Date End Date Taking? Authorizing Provider  albuterol  (VENTOLIN  HFA) 108 (90 Base) MCG/ACT inhaler Inhale 2 puffs into the lungs every 6 (six) hours as needed for wheezing or shortness of breath. 10/24/20   Neda Jennet LABOR, MD  Cholecalciferol (VITAMIN D3 PO) Take 1 tablet by mouth daily.    [provider]  ezetimibe (ZETIA) 10 MG tablet Take 10 mg by mouth at bedtime.  08/20/18   [provider]  fexofenadine-pseudoephedrine (ALLEGRA-D) 60-120 MG 12 hr tablet Take 1 tablet by mouth daily as needed.     [provider]  FLOVENT  HFA 220 MCG/ACT inhaler TAKE 2 PUFFS BY MOUTH TWICE A DAY 11/06/21   Olalere, Adewale A, MD  Fluticasone  Propionate (XHANCE ) 93 MCG/ACT EXHU Place 1 spray into the nose 2 (two) times daily. 09/27/18   Luke Orlan HERO, DO  levothyroxine (SYNTHROID) 75 MCG tablet Take 75 mcg by mouth daily. 01/12/19   [provider]  losartan-hydrochlorothiazide  (HYZAAR) 100-12.5 MG tablet Take 1 tablet by mouth at bedtime.  09/24/18   [provider]  Omega 3-6-9 Fatty Acids (OMEGA-3-6-9 PO) Take 1 tablet by mouth at bedtime.     [provider]  ondansetron  (ZOFRAN -ODT) 4 MG  disintegrating tablet Take 1 tablet (4 mg total) by mouth every 8 (eight) hours as needed for nausea. 10/13/18   Ines Onetha NOVAK, MD  oxybutynin (DITROPAN-XL) 10 MG 24 hr tablet Take 10 mg by mouth at bedtime.  03/25/19   [provider]  pregabalin (LYRICA) 150 MG capsule Take 150 mg by mouth 2 (two) times daily.    [provider]  pyridoxine (B-6) 100 MG tablet Take 100 mg by mouth at bedtime.     [provider]  rizatriptan  (MAXALT -MLT) 10 MG disintegrating tablet Take 1 tablet (10 mg total) by mouth as needed for migraine. May repeat in 2 hours if needed. Max 2x a day. 07/12/19   Lomax, Amy, NP  sertraline (ZOLOFT) 50 MG tablet at bedtime.  09/23/18   [provider]  topiramate  (TOPAMAX ) 50 MG tablet Take 1 tablet (50 mg total) by mouth at bedtime. 07/12/19   Lomax, Amy, NP  traMADol (ULTRAM) 50 MG tablet Take 50 mg by mouth every 6 (six) hours as needed.    [provider]  TURMERIC PO Take 1 capsule by mouth at bedtime.    [provider]  vitamin B-12 (CYANOCOBALAMIN ) 250 MCG tablet Take 250 mcg by mouth at bedtime.     [provider]    Allergies: Patient has no known allergies.    Review of Systems  Updated Vital Signs BP (!) 153/86 (BP Location: Right Arm)   Pulse 91   Temp 97.8  F (36.6 C) (Oral)   Resp 18   SpO2 100%   Physical Exam Vitals and nursing note reviewed.  Constitutional:      General: She is in acute distress.     Appearance: She is well-developed.     Comments: Patient appears uncomfortable  HENT:     Head: Normocephalic and atraumatic.     Right Ear: External ear normal.     Left Ear: External ear normal.     Nose: Nose normal.  Eyes:     Conjunctiva/sclera: Conjunctivae normal.  Cardiovascular:     Rate and Rhythm: Normal rate and regular rhythm.     Heart sounds: No murmur heard. Pulmonary:     Effort: No respiratory distress.     Breath sounds: No wheezing, rhonchi or rales.   Abdominal:     Palpations: Abdomen is soft.     Tenderness: There is no abdominal tenderness. There is no guarding or rebound.     Comments: No focal abdominal pain.  Musculoskeletal:     Cervical back: Normal range of motion and neck supple.       Back:     Right lower leg: No edema.     Left lower leg: No edema.  Skin:    General: Skin is warm and dry.     Findings: No rash.  Neurological:     General: No focal deficit present.     Mental Status: She is alert. Mental status is at baseline.     Motor: No weakness.  Psychiatric:        Mood and Affect: Mood normal.     (all labs ordered are listed, but only abnormal results are displayed) Labs Reviewed  CBC WITH DIFFERENTIAL/PLATELET - Abnormal; Notable for the following components:      Result Value   HCT 35.6 (*)    Eosinophils Absolute 0.7 (*)    All other components within normal limits  COMPREHENSIVE METABOLIC PANEL WITH GFR - Abnormal; Notable for the following components:   Potassium 3.4 (*)    CO2 18 (*)    ALT 58 (*)    All other components within normal limits  URINALYSIS, ROUTINE W REFLEX MICROSCOPIC - Abnormal; Notable for the following components:   Color, Urine COLORLESS (*)    Specific Gravity, Urine <1.005 (*)    All other components within normal limits  LIPASE, BLOOD  PREGNANCY, URINE    EKG: None  Radiology: CT Renal Stone Study Result Date: 07/22/2024 CLINICAL DATA:  Abdominal/flank pain, stone suspected right flank pain, severe EXAM: CT ABDOMEN AND PELVIS WITHOUT CONTRAST TECHNIQUE: Multidetector CT imaging of the abdomen and pelvis was performed following the standard protocol without IV contrast. RADIATION DOSE REDUCTION: This exam was performed according to the departmental dose-optimization program which includes automated exposure control, adjustment of the mA and/or kV according to patient size and/or use of iterative reconstruction technique. COMPARISON:  None Available. FINDINGS: Of note,  the lack of intravenous contrast limits evaluation of the solid organ parenchyma and vascularity. Lower chest: No focal airspace consolidation or pleural effusion. Trace pericardial effusion. Hepatobiliary: No mass.No radiopaque stones or wall thickening of the gallbladder. No intrahepatic or extrahepatic biliary ductal dilation. Pancreas: No mass or main ductal dilation. No peripancreatic inflammation or fluid collection. Spleen: Normal size. No mass. Adrenals/Urinary Tract: No adrenal masses. No renal mass. No hydronephrosis or nephrolithiasis. The urinary bladder is distended without focal abnormality. Stomach/Bowel: The stomach is decompressed without focal abnormality. No small bowel wall  thickening or inflammation. No small bowel obstruction.Normal appendix. Vascular/Lymphatic: No aortic aneurysm. Scattered aortoiliac atherosclerosis. No intraabdominal or pelvic lymphadenopathy. Reproductive: The uterus and ovaries are within normal limits for patient's age. No free pelvic fluid. Other: No pneumoperitoneum, ascites, or mesenteric inflammation. Musculoskeletal: No acute fracture or destructive lesion. Multilevel degenerative disc disease of the spine. Scattered small bone islands in the pelvis and proximal femora. IMPRESSION: No acute intra-abdominal or pelvic abnormality. Aortic Atherosclerosis (ICD10-I70.0). Electronically Signed   By: Rogelia Myers M.D.   On: 07/22/2024 16:04     Procedures   Medications Ordered in the ED  ketorolac  (TORADOL ) 15 MG/ML injection 15 mg (15 mg Intravenous Given 07/22/24 1451)  ondansetron  (ZOFRAN ) injection 4 mg (4 mg Intravenous Given 07/22/24 1451)   ED Course  Patient seen and examined. History obtained directly from patient.   Labs/EKG: Ordered CBC, CMP, lipase, UA, pregnancy.  Imaging: Ordered CT renal protocol.  Medications/Fluids: Ordered: IV Toradol .   Most recent vital signs reviewed and are as follows: BP (!) 153/86 (BP Location: Right Arm)    Pulse 91   Temp 97.8 F (36.6 C) (Oral)   Resp 18   SpO2 100%   Initial impression: Flank pain  3:24 PM Reassessment performed. Patient appears slightly improved, Toradol  took the edge off.  Imaging personally visualized and interpreted including: CT no obvious stones, no signs of perforation, awaiting read.  Reviewed pertinent lab work and imaging with patient at bedside. Questions answered.   Most current vital signs reviewed and are as follows: BP (!) 153/86 (BP Location: Right Arm)   Pulse 91   Temp 97.8 F (36.6 C) (Oral)   Resp 18   SpO2 100%   Plan: Reassess, labs pending.  5:09 PM Reassessment performed. Patient appears stable, improved.  No recurrent pain.  I reexamined the patient, no tenderness in right lower back area.  She does not report any skin changes.  Labs personally reviewed and interpreted including: CBC with normal white blood cell count and hemoglobin; CMP slightly low potassium at 3.4 and ALT slightly elevated at 58; lipase normal; UA without signs of infection; pregnancy negative.  Imaging personally visualized and interpreted including: CT, agree no acute findings.  Reviewed pertinent lab work and imaging with patient at bedside. Questions answered.   Most current vital signs reviewed and are as follows: BP 116/75   Pulse 80   Temp 97.8 F (36.6 C) (Oral)   Resp 18   SpO2 94%   Plan: Discharge to home with symptom control, close monitoring.   5:41 PM Reassessment performed. Patient appears stable, comfortable, reports mild pain.  Reviewed pertinent lab work and imaging with patient at bedside. Questions answered.   Most current vital signs reviewed and are as follows: BP 116/75   Pulse 80   Temp 97.8 F (36.6 C) (Oral)   Resp 18   SpO2 94%   Plan: Discharge to home.    Prescriptions written for: Trial Flexeril   Patient counseled on proper use of muscle relaxant medication.  They were told not to drink alcohol, drive any vehicle, or  do any dangerous activities while taking this medication.  Patient verbalized understanding.  Other home care instructions discussed: Offered pain medication, patient states that she has tramadol at home.  Will also trial Lidoderm  patches.  ED return instructions discussed: The patient was urged to return to the Emergency Department immediately with worsening of current symptoms, worsening abdominal pain, persistent vomiting, blood noted in stools, fever, or any other  concerns. The patient verbalized understanding.   Follow-up instructions discussed: Patient encouraged to follow-up with their PCP in 2-3 days.                                   Medical Decision Making Amount and/or Complexity of Data Reviewed Labs: ordered. Radiology: ordered.  Risk Prescription drug management.   For this patient's complaint of abdominal pain, the following conditions were considered on the differential diagnosis: gastritis/PUD, enteritis/duodenitis, appendicitis, cholelithiasis/cholecystitis, cholangitis, pancreatitis, ruptured viscus, colitis, diverticulitis, small/large bowel obstruction, proctitis, cystitis, pyelonephritis, ureteral colic, aortic dissection, aortic aneurysm. In women, ectopic pregnancy, pelvic inflammatory disease, ovarian cysts, and tubo-ovarian abscess were also considered. Atypical chest etiologies were also considered including ACS, PE, and pneumonia.  Patient with severe pain on arrival, improved with Toradol .  Concern for kidney stone, however CT does not demonstrate this.  Patient's pain has been controlled during ED stay.  Lab workup is reassuring.  CT scan without any acute findings.  Normal pulses in extremities, no neurovascular symptoms.  The patient's vital signs, pertinent lab work and imaging were reviewed and interpreted as discussed in the ED course. Hospitalization was considered for further testing, treatments, or serial exams/observation. However as patient is  well-appearing, has a stable exam, and reassuring studies today, I do not feel that they warrant admission at this time. This plan was discussed with the patient who verbalizes agreement and comfort with this plan and seems reliable and able to return to the Emergency Department with worsening or changing symptoms.       Final diagnoses:  Flank pain    ED Discharge Orders          Ordered    cyclobenzaprine  (FLEXERIL ) 10 MG tablet  2 times daily PRN        07/22/24 1729               Desiderio Chew, PA-C 07/22/24 1744    Zackowski, Scott, MD 07/26/24 2148

## 2024-07-22 NOTE — ED Notes (Signed)
 Provider at bedside

## 2024-07-22 NOTE — ED Triage Notes (Signed)
 C/o left sided lower back pain x 1 week. Worse in the last 4 hours. Denies injuries. No hx of kidney stones.

## 2024-07-26 DIAGNOSIS — Z1389 Encounter for screening for other disorder: Secondary | ICD-10-CM | POA: Diagnosis not present

## 2024-07-26 DIAGNOSIS — E7800 Pure hypercholesterolemia, unspecified: Secondary | ICD-10-CM | POA: Diagnosis not present

## 2024-07-26 DIAGNOSIS — M797 Fibromyalgia: Secondary | ICD-10-CM | POA: Diagnosis not present

## 2024-07-26 DIAGNOSIS — E038 Other specified hypothyroidism: Secondary | ICD-10-CM | POA: Diagnosis not present

## 2024-07-26 DIAGNOSIS — I10 Essential (primary) hypertension: Secondary | ICD-10-CM | POA: Diagnosis not present

## 2024-08-10 DIAGNOSIS — M25561 Pain in right knee: Secondary | ICD-10-CM | POA: Diagnosis not present

## 2024-08-29 DIAGNOSIS — I319 Disease of pericardium, unspecified: Secondary | ICD-10-CM | POA: Diagnosis not present

## 2024-09-01 DIAGNOSIS — M170 Bilateral primary osteoarthritis of knee: Secondary | ICD-10-CM | POA: Diagnosis not present

## 2024-09-07 DIAGNOSIS — M25561 Pain in right knee: Secondary | ICD-10-CM | POA: Diagnosis not present

## 2024-10-05 DIAGNOSIS — M25561 Pain in right knee: Secondary | ICD-10-CM | POA: Diagnosis not present

## 2024-10-11 DIAGNOSIS — M170 Bilateral primary osteoarthritis of knee: Secondary | ICD-10-CM | POA: Diagnosis not present

## 2024-10-18 DIAGNOSIS — M170 Bilateral primary osteoarthritis of knee: Secondary | ICD-10-CM | POA: Diagnosis not present

## 2024-10-25 DIAGNOSIS — M170 Bilateral primary osteoarthritis of knee: Secondary | ICD-10-CM | POA: Diagnosis not present

## 2024-11-01 DIAGNOSIS — Z79891 Long term (current) use of opiate analgesic: Secondary | ICD-10-CM | POA: Diagnosis not present

## 2024-11-01 DIAGNOSIS — M25561 Pain in right knee: Secondary | ICD-10-CM | POA: Diagnosis not present

## 2024-11-22 DIAGNOSIS — I10 Essential (primary) hypertension: Secondary | ICD-10-CM | POA: Diagnosis not present

## 2024-11-22 DIAGNOSIS — F3341 Major depressive disorder, recurrent, in partial remission: Secondary | ICD-10-CM | POA: Diagnosis not present

## 2024-11-22 DIAGNOSIS — E038 Other specified hypothyroidism: Secondary | ICD-10-CM | POA: Diagnosis not present

## 2024-11-22 DIAGNOSIS — M797 Fibromyalgia: Secondary | ICD-10-CM | POA: Diagnosis not present

## 2024-12-06 DIAGNOSIS — M25561 Pain in right knee: Secondary | ICD-10-CM | POA: Diagnosis not present
